# Patient Record
Sex: Female | Born: 1965 | Race: White | Hispanic: No | Marital: Married | Smoking: Never smoker
Health system: Southern US, Community
[De-identification: ages and names within clinical notes are randomized; demographics above are authoritative.]

## PROBLEM LIST (undated history)

## (undated) DIAGNOSIS — L719 Rosacea, unspecified: Secondary | ICD-10-CM

## (undated) DIAGNOSIS — H3552 Pigmentary retinal dystrophy: Secondary | ICD-10-CM

## (undated) HISTORY — DX: Pigmentary retinal dystrophy: H35.52

## (undated) HISTORY — DX: Rosacea, unspecified: L71.9

---
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
## 1999-11-18 ENCOUNTER — Other Ambulatory Visit: Admission: RE | Admit: 1999-11-18 | Discharge: 1999-11-18 | Payer: Self-pay | Admitting: Obstetrics and Gynecology

## 1999-12-23 ENCOUNTER — Encounter (INDEPENDENT_AMBULATORY_CARE_PROVIDER_SITE_OTHER): Payer: Self-pay

## 1999-12-23 ENCOUNTER — Other Ambulatory Visit: Admission: RE | Admit: 1999-12-23 | Discharge: 1999-12-23 | Payer: Self-pay | Admitting: Obstetrics and Gynecology

## 2000-05-24 ENCOUNTER — Other Ambulatory Visit: Admission: RE | Admit: 2000-05-24 | Discharge: 2000-05-24 | Payer: Self-pay | Admitting: Obstetrics and Gynecology

## 2001-07-30 ENCOUNTER — Other Ambulatory Visit: Admission: RE | Admit: 2001-07-30 | Discharge: 2001-07-30 | Payer: Self-pay | Admitting: Obstetrics and Gynecology

## 2002-08-06 ENCOUNTER — Other Ambulatory Visit: Admission: RE | Admit: 2002-08-06 | Discharge: 2002-08-06 | Payer: Self-pay | Admitting: Obstetrics and Gynecology

## 2003-09-10 ENCOUNTER — Other Ambulatory Visit: Admission: RE | Admit: 2003-09-10 | Discharge: 2003-09-10 | Payer: Self-pay | Admitting: Obstetrics and Gynecology

## 2004-10-31 ENCOUNTER — Other Ambulatory Visit: Admission: RE | Admit: 2004-10-31 | Discharge: 2004-10-31 | Payer: Self-pay | Admitting: Obstetrics and Gynecology

## 2005-10-09 ENCOUNTER — Other Ambulatory Visit: Admission: RE | Admit: 2005-10-09 | Discharge: 2005-10-09 | Payer: Self-pay | Admitting: Obstetrics and Gynecology

## 2006-09-05 ENCOUNTER — Encounter: Admission: RE | Admit: 2006-09-05 | Discharge: 2006-09-05 | Payer: Self-pay | Admitting: Obstetrics and Gynecology

## 2006-10-15 ENCOUNTER — Other Ambulatory Visit: Admission: RE | Admit: 2006-10-15 | Discharge: 2006-10-15 | Payer: Self-pay | Admitting: Obstetrics and Gynecology

## 2007-08-29 DIAGNOSIS — L719 Rosacea, unspecified: Secondary | ICD-10-CM

## 2007-08-29 HISTORY — DX: Rosacea, unspecified: L71.9

## 2007-09-19 ENCOUNTER — Encounter: Admission: RE | Admit: 2007-09-19 | Discharge: 2007-09-19 | Payer: Self-pay | Admitting: Obstetrics and Gynecology

## 2007-10-22 ENCOUNTER — Other Ambulatory Visit: Admission: RE | Admit: 2007-10-22 | Discharge: 2007-10-22 | Payer: Self-pay | Admitting: Obstetrics and Gynecology

## 2008-10-05 ENCOUNTER — Encounter: Admission: RE | Admit: 2008-10-05 | Discharge: 2008-10-05 | Payer: Self-pay | Admitting: Obstetrics and Gynecology

## 2008-10-08 ENCOUNTER — Encounter: Admission: RE | Admit: 2008-10-08 | Discharge: 2008-10-08 | Payer: Self-pay | Admitting: Obstetrics and Gynecology

## 2008-11-02 ENCOUNTER — Other Ambulatory Visit: Admission: RE | Admit: 2008-11-02 | Discharge: 2008-11-02 | Payer: Self-pay | Admitting: Obstetrics and Gynecology

## 2009-10-26 ENCOUNTER — Encounter: Admission: RE | Admit: 2009-10-26 | Discharge: 2009-10-26 | Payer: Self-pay | Admitting: Obstetrics and Gynecology

## 2009-11-02 ENCOUNTER — Encounter: Admission: RE | Admit: 2009-11-02 | Discharge: 2009-11-02 | Payer: Self-pay | Admitting: Obstetrics and Gynecology

## 2009-11-10 ENCOUNTER — Other Ambulatory Visit: Admission: RE | Admit: 2009-11-10 | Discharge: 2009-11-10 | Payer: Self-pay | Admitting: Obstetrics and Gynecology

## 2010-05-10 ENCOUNTER — Encounter: Admission: RE | Admit: 2010-05-10 | Discharge: 2010-05-10 | Payer: Self-pay | Admitting: Obstetrics and Gynecology

## 2010-08-28 DIAGNOSIS — H3552 Pigmentary retinal dystrophy: Secondary | ICD-10-CM

## 2010-08-28 HISTORY — DX: Pigmentary retinal dystrophy: H35.52

## 2010-09-18 ENCOUNTER — Encounter: Payer: Self-pay | Admitting: Obstetrics and Gynecology

## 2010-10-06 ENCOUNTER — Other Ambulatory Visit: Payer: Self-pay | Admitting: Obstetrics and Gynecology

## 2010-10-06 DIAGNOSIS — Z1231 Encounter for screening mammogram for malignant neoplasm of breast: Secondary | ICD-10-CM

## 2010-11-02 ENCOUNTER — Ambulatory Visit
Admission: RE | Admit: 2010-11-02 | Discharge: 2010-11-02 | Disposition: A | Discharge status: Home / Self Care | Payer: 59 | Source: Ambulatory Visit | Attending: Obstetrics and Gynecology | Admitting: Obstetrics and Gynecology

## 2010-11-02 DIAGNOSIS — Z1231 Encounter for screening mammogram for malignant neoplasm of breast: Secondary | ICD-10-CM

## 2011-11-07 ENCOUNTER — Other Ambulatory Visit: Payer: Self-pay | Admitting: Obstetrics and Gynecology

## 2011-11-07 DIAGNOSIS — Z1231 Encounter for screening mammogram for malignant neoplasm of breast: Secondary | ICD-10-CM

## 2011-11-17 ENCOUNTER — Ambulatory Visit
Admission: RE | Admit: 2011-11-17 | Discharge: 2011-11-17 | Disposition: A | Discharge status: Home / Self Care | Payer: 59 | Source: Ambulatory Visit | Attending: Obstetrics and Gynecology | Admitting: Obstetrics and Gynecology

## 2011-11-17 DIAGNOSIS — Z1231 Encounter for screening mammogram for malignant neoplasm of breast: Secondary | ICD-10-CM

## 2011-11-17 LAB — HM MAMMOGRAPHY: HM Mammogram: NORMAL

## 2012-11-04 ENCOUNTER — Other Ambulatory Visit: Payer: Self-pay

## 2012-11-04 DIAGNOSIS — Z1231 Encounter for screening mammogram for malignant neoplasm of breast: Secondary | ICD-10-CM

## 2012-11-28 ENCOUNTER — Ambulatory Visit
Admission: RE | Admit: 2012-11-28 | Discharge: 2012-11-28 | Disposition: A | Discharge status: Home / Self Care | Payer: 59 | Source: Ambulatory Visit

## 2012-11-28 DIAGNOSIS — Z1231 Encounter for screening mammogram for malignant neoplasm of breast: Secondary | ICD-10-CM

## 2012-11-29 ENCOUNTER — Encounter: Payer: Self-pay | Admitting: Obstetrics and Gynecology

## 2012-12-04 ENCOUNTER — Encounter: Payer: Self-pay | Admitting: Obstetrics and Gynecology

## 2012-12-04 ENCOUNTER — Ambulatory Visit (INDEPENDENT_AMBULATORY_CARE_PROVIDER_SITE_OTHER): Payer: 59 | Admitting: Obstetrics and Gynecology

## 2012-12-04 VITALS — BP 100/60 | Ht 68.0 in | Wt 153.0 lb

## 2012-12-04 DIAGNOSIS — H3552 Pigmentary retinal dystrophy: Secondary | ICD-10-CM | POA: Insufficient documentation

## 2012-12-04 DIAGNOSIS — Z0000 Encounter for general adult medical examination without abnormal findings: Secondary | ICD-10-CM

## 2012-12-04 DIAGNOSIS — Z01419 Encounter for gynecological examination (general) (routine) without abnormal findings: Secondary | ICD-10-CM

## 2012-12-04 LAB — POCT URINALYSIS DIPSTICK
Nitrite, UA: NEGATIVE
pH, UA: 5

## 2012-12-04 NOTE — Progress Notes (Addendum)
47 y.o.  Married  Caucasian female   [id number] here for annual exam.  Started running - runs 3 miles 3 times a week.  Eyes are about the same.  Hasn't seen a retinal specialist in a while.  Takes 8000 units a day.    Patient's last menstrual period was 11/22/2012.          Sexually active: yes  The current method of family planning is vasectomy.    Exercising: running 3 day a week Last mammogram:  November 04, 2012 Last pap smear:10/2009 normal History of abnormal pap: 2007 Smoking:no Alcohol:7 beers a week Last colonoscopy:none Last Bone Density:  none Last tetanus shot:less than 10 years ago Last cholesterol check: not sure  Hgb:       12.5         Urine:neg    No health maintenance topics applied.  Family History  Problem Relation Age of Onset  . Hypertension Mother   . Cancer Mother     melanoma on face  . Hypertension Father    Mother just finished chemo and radiation for metastatic melanoma There is no problem list on file for this patient.   Past Medical History  Diagnosis Date  . Retinitis pigmentosa 2012  . Rosacea 2009    Past Surgical History  Procedure Laterality Date  . Tonsillectomy  1978    Allergies: Review of patient's allergies indicates no known allergies.  Current Outpatient Prescriptions  Medication Sig Dispense Refill  . Calcium Carbonate-Vitamin D (CALTRATE 600+D PO) Take by mouth.      . Cholecalciferol (VITAMIN D) 400 UNITS capsule Take 400 Units by mouth daily.      Marland Kitchen ibuprofen (ADVIL,MOTRIN) 100 MG tablet Take 100 mg by mouth every 6 (six) hours as needed for fever.      . vitamin A 8000 UNIT capsule Take 8,000 Units by mouth daily.       No current facility-administered medications for this visit.    ROS: Pertinent items are noted in HPI.  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)  Exam:    BP 100/60  Ht 5\' 8"  (1.727 m)  Wt 153 lb (69.4 kg)  BMI 23.27 kg/m2  LMP 11/22/2012  Up 5 pounds from last year Wt  Readings from Last 3 Encounters:  12/04/12 153 lb (69.4 kg)     Ht Readings from Last 3 Encounters:  12/04/12 5\' 8"  (1.727 m)    General appearance: alert, cooperative and appears stated age Head: Normocephalic, without obvious abnormality, atraumatic Neck: no adenopathy, supple, symmetrical, trachea midline and thyroid not enlarged, symmetric, no tenderness/mass/nodules Lungs: clear to auscultation bilaterally Breasts: Inspection negative, No nipple retraction or dimpling, No nipple discharge or bleeding, No axillary or supraclavicular adenopathy, Normal to palpation without dominant masses Heart: regular rate and rhythm Abdomen: soft, non-tender; bowel sounds normal; no masses,  no organomegaly Extremities: extremities normal, atraumatic, no cyanosis or edema Skin: Skin color, texture, turgor normal. No rashes or lesions Lymph nodes: Cervical, supraclavicular, and axillary nodes normal. No abnormal inguinal nodes palpated Neurologic: Grossly normal   Pelvic: External genitalia:  no lesions              Urethra:  normal appearing urethra with no masses, tenderness or lesions              Bartholins and Skenes: normal                 Vagina: normal appearing vagina with normal  color and discharge, no lesions              Cervix: normal appearance              Pap taken: yes        Bimanual Exam:  Uterus:  uterus is normal size, shape, consistency and nontender, RV and cannot feel known fibroid                                       Adnexa: normal adnexa in size, nontender and no masses                                      Rectovaginal: Confirms                                      Anus:  normal sphincter tone, no lesions  A: normal gyn exam, vasectomy     Retinitis pigmentosa, treated with Vit A.  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  Pelvis otherwise nl.     P:  mammogram counseled on breast self exam, mammography screening, adequate intake of calcium and vitamin D, diet and exercise return annually or prn   Check CMP and FLP.  CMP d/t Vit A tx.     An After Visit Summary was printed and given to the patient.

## 2012-12-04 NOTE — Patient Instructions (Signed)

## 2012-12-05 ENCOUNTER — Telehealth: Payer: Self-pay | Admitting: Obstetrics and Gynecology

## 2012-12-05 LAB — COMPREHENSIVE METABOLIC PANEL
CO2: 31 mEq/L (ref 19–32)
Chloride: 105 mEq/L (ref 96–112)
Creat: 0.86 mg/dL (ref 0.50–1.10)
Total Bilirubin: 0.4 mg/dL (ref 0.3–1.2)
Total Protein: 6.4 g/dL (ref 6.0–8.3)

## 2012-12-05 LAB — LIPID PANEL
Cholesterol: 192 mg/dL (ref 0–200)
HDL: 68 mg/dL (ref >39)
LDL Cholesterol: 101 mg/dL — ABNORMAL HIGH (ref 0–99)
Total CHOL/HDL Ratio: 2.8 Ratio
VLDL: 23 mg/dL (ref 0–40)

## 2012-12-06 LAB — HEMOGLOBIN, FINGERSTICK: Hemoglobin, fingerstick: 12.5 g/dL (ref 12.0–16.0)

## 2012-12-06 NOTE — Telephone Encounter (Signed)
Pt already notified of lab results today cm

## 2013-11-27 ENCOUNTER — Other Ambulatory Visit: Payer: Self-pay

## 2013-11-27 DIAGNOSIS — Z1231 Encounter for screening mammogram for malignant neoplasm of breast: Secondary | ICD-10-CM

## 2013-12-17 ENCOUNTER — Ambulatory Visit: Payer: 59 | Admitting: Gynecology

## 2013-12-17 ENCOUNTER — Ambulatory Visit
Admission: RE | Admit: 2013-12-17 | Discharge: 2013-12-17 | Disposition: A | Discharge status: Home / Self Care | Payer: Self-pay | Source: Ambulatory Visit

## 2013-12-17 ENCOUNTER — Ambulatory Visit: Payer: 59 | Admitting: Obstetrics and Gynecology

## 2013-12-17 ENCOUNTER — Encounter (INDEPENDENT_AMBULATORY_CARE_PROVIDER_SITE_OTHER): Payer: Self-pay

## 2013-12-17 DIAGNOSIS — Z1231 Encounter for screening mammogram for malignant neoplasm of breast: Secondary | ICD-10-CM

## 2013-12-23 ENCOUNTER — Encounter: Payer: Self-pay | Admitting: Gynecology

## 2013-12-23 ENCOUNTER — Ambulatory Visit (INDEPENDENT_AMBULATORY_CARE_PROVIDER_SITE_OTHER): Payer: 59 | Admitting: Gynecology

## 2013-12-23 VITALS — BP 90/60 | HR 64 | Temp 98.2°F | Ht 67.75 in | Wt 150.4 lb

## 2013-12-23 DIAGNOSIS — Z01419 Encounter for gynecological examination (general) (routine) without abnormal findings: Secondary | ICD-10-CM

## 2013-12-23 DIAGNOSIS — Z0000 Encounter for general adult medical examination without abnormal findings: Secondary | ICD-10-CM

## 2013-12-23 LAB — POCT URINALYSIS DIPSTICK
BILIRUBIN UA: NEGATIVE
Blood, UA: NEGATIVE
Glucose, UA: NEGATIVE
Ketones, UA: NEGATIVE
Leukocytes, UA: NEGATIVE
NITRITE UA: NEGATIVE
PH UA: 5
PROTEIN UA: NEGATIVE
Urobilinogen, UA: NEGATIVE

## 2013-12-23 LAB — HEMOGLOBIN, FINGERSTICK: Hemoglobin, fingerstick: 14.2 g/dL (ref 12.0–16.0)

## 2013-12-23 NOTE — Progress Notes (Signed)
48 y.o. marriedCaucasian female   [phone] here for annual exam. Pt is currently sexually active.  Saw retinal specialist at [facility], noncompliant with vit a.  Cycles are regular and no dyspareunia  Pt has not been able to exercise due to other obligations with sons.  Patient's last menstrual period was 12/10/2013.          Sexually active: yes  The current method of family planning is vasectomy.    Exercising: yes  some exercise Last pap: 12-04-12 neg HPV HR neg Alcohol: occ beer Tobacco: none BSE: done occ Mammo: 12-17-13 neg Hgb-14.2, Poct urine-neg    Health Maintenance  Topic Date Due  . Tetanus/tdap  08/30/1985  . Influenza Vaccine  03/28/2014  . Pap Smear  12/05/2015    Family History  Problem Relation Age of Onset  . Hypertension Mother   . Cancer Mother     melanoma on face  . Hypertension Father   . Breast cancer Maternal Aunt   . Heart attack Paternal Grandfather     Patient Active Problem List   Diagnosis Date Noted  . Retinitis pigmentosa both eyes 12/04/2012    Past Medical History  Diagnosis Date  . Retinitis pigmentosa 2012  . Rosacea 2009    Past Surgical History  Procedure Laterality Date  . Tonsillectomy  1978    Allergies: Review of patient's allergies indicates no known allergies.  Current Outpatient Prescriptions  Medication Sig Dispense Refill  . ibuprofen (ADVIL,MOTRIN) 100 MG tablet Take 100 mg by mouth every 6 (six) hours as needed for fever.       No current facility-administered medications for this visit.    ROS: Pertinent items are noted in HPI.  Exam:    BP 90/60  Pulse 64  Temp(Src) 98.2 F (36.8 C)  Ht 5' 7.75" (1.721 m)  Wt 150 lb 6.4 oz (68.221 kg)  BMI 23.03 kg/m2  LMP 12/10/2013 Weight change: @WEIGHTCHANGE @ Last 3 height recordings:  Ht Readings from Last 3 Encounters:  12/23/13 5' 7.75" (1.721 m)  12/04/12 5\' 8"  (1.727 m)   General appearance: alert, cooperative and appears stated age Head: Normocephalic,  without obvious abnormality, atraumatic Neck: no adenopathy, no carotid bruit, no JVD, supple, symmetrical, trachea midline and thyroid not enlarged, symmetric, no tenderness/mass/nodules Lungs: clear to auscultation bilaterally Breasts: normal appearance, no masses or tenderness Heart: regular rate and rhythm, S1, S2 normal, no murmur, click, rub or gallop Abdomen: soft, non-tender; bowel sounds normal; no masses,  no organomegaly Extremities: extremities normal, atraumatic, no cyanosis or edema Skin: Skin color, texture, turgor normal. No rashes or lesions Lymph nodes: Cervical, supraclavicular, and axillary nodes normal. no inguinal nodes palpated Neurologic: Grossly normal   Pelvic: External genitalia:  no lesions              Urethra: normal appearing urethra with no masses, tenderness or lesions              Bartholins and Skenes: normal                 Vagina: normal appearing vagina with normal color and discharge, no lesions              Cervix: normal appearance              Pap taken: no        Bimanual Exam:  Uterus:  uterus is normal size, shape, consistency and nontender  Adnexa:    normal adnexa in size, nontender and no masses                                      Rectovaginal: Confirms                                      Anus:  normal sphincter tone, no lesions  A: well woman      P: mammogram pap smear guidelines reviewed counseled on breast self exam, mammography screening, adequate intake of calcium and vitamin D, diet and exercise return annually or prn   An After Visit Summary was printed and given to the patient.

## 2013-12-23 NOTE — Patient Instructions (Signed)

## 2014-05-21 ENCOUNTER — Telehealth: Payer: Self-pay | Admitting: Gynecology

## 2014-05-21 NOTE — Telephone Encounter (Signed)
Patient dropped off a "proof of  physical exam letter", sent to dr.Lathrop for signature. Will mail to patient in self addressed envelope.

## 2014-05-26 ENCOUNTER — Encounter: Payer: Self-pay | Admitting: Gynecology

## 2014-06-29 ENCOUNTER — Encounter: Payer: Self-pay | Admitting: Gynecology

## 2014-07-28 ENCOUNTER — Telehealth: Payer: Self-pay

## 2014-07-28 NOTE — Telephone Encounter (Signed)
AEX has been rescheduled with Kem Boroughs on 12/28/14

## 2014-12-25 ENCOUNTER — Ambulatory Visit: Payer: 59 | Admitting: Gynecology

## 2014-12-28 ENCOUNTER — Ambulatory Visit: Payer: Self-pay | Admitting: Nurse Practitioner

## 2015-01-20 ENCOUNTER — Other Ambulatory Visit: Payer: Self-pay

## 2015-02-04 ENCOUNTER — Other Ambulatory Visit: Payer: Self-pay

## 2015-02-04 DIAGNOSIS — Z1231 Encounter for screening mammogram for malignant neoplasm of breast: Secondary | ICD-10-CM

## 2015-03-09 ENCOUNTER — Ambulatory Visit
Admission: RE | Admit: 2015-03-09 | Discharge: 2015-03-09 | Disposition: A | Discharge status: Home / Self Care | Payer: 59 | Source: Ambulatory Visit

## 2015-03-09 DIAGNOSIS — Z1231 Encounter for screening mammogram for malignant neoplasm of breast: Secondary | ICD-10-CM

## 2015-03-31 ENCOUNTER — Ambulatory Visit (INDEPENDENT_AMBULATORY_CARE_PROVIDER_SITE_OTHER): Payer: 59 | Admitting: Obstetrics and Gynecology

## 2015-03-31 ENCOUNTER — Encounter: Payer: Self-pay | Admitting: Obstetrics and Gynecology

## 2015-03-31 VITALS — BP 92/60 | HR 84 | Resp 16 | Ht 67.75 in | Wt 149.0 lb

## 2015-03-31 DIAGNOSIS — Z0000 Encounter for general adult medical examination without abnormal findings: Secondary | ICD-10-CM

## 2015-03-31 DIAGNOSIS — D252 Subserosal leiomyoma of uterus: Secondary | ICD-10-CM | POA: Diagnosis not present

## 2015-03-31 DIAGNOSIS — Z01419 Encounter for gynecological examination (general) (routine) without abnormal findings: Secondary | ICD-10-CM | POA: Diagnosis not present

## 2015-03-31 LAB — POCT URINALYSIS DIPSTICK
Bilirubin, UA: NEGATIVE
GLUCOSE UA: NEGATIVE
KETONES UA: NEGATIVE
LEUKOCYTES UA: NEGATIVE
NITRITE UA: NEGATIVE
Protein, UA: NEGATIVE
RBC UA: NEGATIVE
Spec Grav, UA: 1.015
UROBILINOGEN UA: NEGATIVE
pH, UA: 7

## 2015-03-31 NOTE — Patient Instructions (Signed)

## 2015-03-31 NOTE — Progress Notes (Signed)
Patient ID: Wendy Small, female   DOB: [birth year], 49 y.o.   MRN: [id number] 49 y.o. [id number] MarriedCaucasianF here for annual exam.   Menses q month x 3-4 days. Saturates a pad in 2-4 hours. No BTB, no cramps. Sexually active, no dyspareunia. In 2013 the patient had an ultrasound for pain and was noted to have a fibroid uterus, largest fibroid was 3.8 cm and subserosal.   Patient's last menstrual period was 03/17/2015.          Sexually active: Yes.    The current method of family planning is vasectomy.    Exercising: Yes.    walking Smoker:  no  Health Maintenance: Pap:  12-04-12 WNL History of abnormal Pap:  no MMG:  03-09-15 WNL Colonoscopy:  N/A BMD:   N/A TDaP:  Up to date per patient  Screening Labs: labs drawn today , Hb today: 13.3, Urine today: WNL    reports that she has never smoked. She has never used smokeless tobacco. She reports that she drinks about 3.0 oz of alcohol per week. She reports that she does not use illicit drugs.  Past Medical History  Diagnosis Date  . Retinitis pigmentosa 2012  . Rosacea 2009    Past Surgical History  Procedure Laterality Date  . Tonsillectomy  1978    Current Outpatient Prescriptions  Medication Sig Dispense Refill  . ibuprofen (ADVIL,MOTRIN) 100 MG tablet Take 100 mg by mouth every 6 (six) hours as needed for fever.     No current facility-administered medications for this visit.    Family History  Problem Relation Age of Onset  . Hypertension Mother   . Cancer Mother     melanoma on face  . Hypertension Father   . Breast cancer Maternal Aunt   . Heart attack Paternal Grandfather     ROS:  Pertinent items are noted in HPI.  Otherwise, a comprehensive ROS was negative.  Exam:   BP 92/60 mmHg  Pulse 84  Resp 16  Ht 5' 7.75" (1.721 m)  Wt 149 lb (67.586 kg)  BMI 22.82 kg/m2  LMP 03/17/2015  Weight change: @WEIGHTCHANGE @ Height:   Height: 5' 7.75" (172.1 cm)  Ht Readings from Last 3 Encounters:  03/31/15 5'  7.75" (1.721 m)  12/23/13 5' 7.75" (1.721 m)  12/04/12 5\' 8"  (1.727 m)    General appearance: alert, cooperative and appears stated age Head: Normocephalic, without obvious abnormality, atraumatic Neck: no adenopathy, supple, symmetrical, trachea midline and thyroid normal to inspection and palpation Lungs: clear to auscultation bilaterally Breasts: normal appearance, no masses or tenderness Heart: regular rate and rhythm Abdomen: soft, non-tender; bowel sounds normal; no masses,  no organomegaly Extremities: extremities normal, atraumatic, no cyanosis or edema Skin: Skin color, texture, turgor normal. No rashes or lesions Lymph nodes: Cervical, supraclavicular, and axillary nodes normal. No abnormal inguinal nodes palpated Neurologic: Grossly normal   Pelvic: External genitalia:  no lesions              Urethra:  normal appearing urethra with no masses, tenderness or lesions              Bartholins and Skenes: normal                 Vagina: normal appearing vagina with normal color and discharge, no lesions              Cervix: no lesions              Pap taken:  No. Bimanual Exam:  Uterus:  The uterus is anteverted, normal sized, mobile, there is a 4 cm projection off of the right side of the uterus, cw her prior diagnosis of a suberosal myoma.               Adnexa: normal adnexa and no mass, fullness, tenderness               Rectovaginal: Confirms               Anus:  normal sphincter tone, no lesions  Chaperone was present for exam.  A:  Well Woman with normal exam  Fibroid uterus, stable  Screening labs  P:   Lipid panel, vit D, CMP  No pap this year  Mammogram UTD

## 2015-04-01 ENCOUNTER — Other Ambulatory Visit: Payer: Self-pay | Admitting: Obstetrics and Gynecology

## 2015-04-01 DIAGNOSIS — Z13220 Encounter for screening for lipoid disorders: Secondary | ICD-10-CM

## 2015-04-01 LAB — LIPID PANEL
CHOL/HDL RATIO: 3.1 ratio (ref <=5.0)
CHOLESTEROL: 198 mg/dL (ref 125–200)
HDL: 64 mg/dL (ref >=46)
LDL Cholesterol: 97 mg/dL (ref <130)
Triglycerides: 186 mg/dL — ABNORMAL HIGH (ref <150)
VLDL: 37 mg/dL — ABNORMAL HIGH (ref <30)

## 2015-04-01 LAB — COMPREHENSIVE METABOLIC PANEL
ALT: 8 U/L (ref 6–29)
AST: 17 U/L (ref 10–35)
Albumin: 4.1 g/dL (ref 3.6–5.1)
Alkaline Phosphatase: 66 U/L (ref 33–115)
BILIRUBIN TOTAL: 0.4 mg/dL (ref 0.2–1.2)
BUN: 12 mg/dL (ref 7–25)
CALCIUM: 9.6 mg/dL (ref 8.6–10.2)
CO2: 25 mmol/L (ref 20–31)
CREATININE: 0.92 mg/dL (ref 0.50–1.10)
Chloride: 102 mmol/L (ref 98–110)
Glucose, Bld: 85 mg/dL (ref 65–99)
Potassium: 4.1 mmol/L (ref 3.5–5.3)
SODIUM: 137 mmol/L (ref 135–146)
TOTAL PROTEIN: 7.2 g/dL (ref 6.1–8.1)

## 2015-04-01 LAB — VITAMIN D 25 HYDROXY (VIT D DEFICIENCY, FRACTURES): VIT D 25 HYDROXY: 30 ng/mL (ref 30–100)

## 2015-04-01 LAB — HEMOGLOBIN, FINGERSTICK: HEMOGLOBIN, FINGERSTICK: 13.3 g/dL (ref 12.0–16.0)

## 2015-04-26 ENCOUNTER — Ambulatory Visit: Payer: Self-pay | Admitting: Obstetrics and Gynecology

## 2015-05-18 ENCOUNTER — Other Ambulatory Visit (INDEPENDENT_AMBULATORY_CARE_PROVIDER_SITE_OTHER): Payer: 59

## 2015-05-18 DIAGNOSIS — Z13220 Encounter for screening for lipoid disorders: Secondary | ICD-10-CM

## 2015-05-18 LAB — LIPID PANEL
CHOL/HDL RATIO: 3.1 ratio (ref <=5.0)
Cholesterol: 191 mg/dL (ref 125–200)
HDL: 62 mg/dL (ref >=46)
LDL CALC: 114 mg/dL (ref <130)
Triglycerides: 73 mg/dL (ref <150)
VLDL: 15 mg/dL (ref <30)

## 2016-04-03 ENCOUNTER — Encounter: Payer: Self-pay | Admitting: Obstetrics and Gynecology

## 2016-04-03 ENCOUNTER — Ambulatory Visit (INDEPENDENT_AMBULATORY_CARE_PROVIDER_SITE_OTHER): Payer: BLUE CROSS/BLUE SHIELD | Admitting: Obstetrics and Gynecology

## 2016-04-03 VITALS — BP 120/80 | HR 68 | Resp 12 | Ht 67.75 in | Wt 151.4 lb

## 2016-04-03 DIAGNOSIS — Z01419 Encounter for gynecological examination (general) (routine) without abnormal findings: Secondary | ICD-10-CM

## 2016-04-03 DIAGNOSIS — Z124 Encounter for screening for malignant neoplasm of cervix: Secondary | ICD-10-CM | POA: Diagnosis not present

## 2016-04-03 DIAGNOSIS — Z0000 Encounter for general adult medical examination without abnormal findings: Secondary | ICD-10-CM | POA: Diagnosis not present

## 2016-04-03 NOTE — Patient Instructions (Signed)

## 2016-04-03 NOTE — Progress Notes (Signed)
50 y.o. [id number] MarriedCaucasianF here for annual exam.  Sexually active, no pain. No menopausal c/o.  Period Cycle (Days): 28 Period Duration (Days): 4-5 days Period Pattern: Regular Menstrual Flow: Moderate, Light Menstrual Control: Maxi pad Menstrual Control Change Freq (Hours): 3-4 times  aday Dysmenorrhea: None  Patient's last menstrual period was 03/10/2016.          Sexually active: Yes.    The current method of family planning is vasectomy.    Exercising: Yes.    Walking Smoker:  no  Health Maintenance: Pap:  12/04/12 WNL neg HR HPV History of abnormal Pap:  no MMG:  03/11/15 BIRADS1 Colonoscopy:  None  BMD:   None TDaP:  UTD   reports that she has never smoked. She has never used smokeless tobacco. She reports that she drinks about 3.0 oz of alcohol per week . She reports that she does not use drugs.2 kids going to college next week. 2 boys, [place] and [place]. 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   Past Medical History:  Diagnosis Date  . Retinitis pigmentosa 2012  . Rosacea 2009    Past Surgical History:  Procedure Laterality Date  . TONSILLECTOMY  1978    Current Outpatient Prescriptions  Medication Sig Dispense Refill  . ibuprofen (ADVIL,MOTRIN) 100 MG tablet Take 100 mg by mouth every 6 (six) hours as needed for fever.     No current facility-administered medications for this visit.     Family History  Problem Relation Age of Onset  . Hypertension Mother   . Cancer Mother     melanoma on face  . Hypertension Father   . Breast cancer Maternal Aunt   . Heart attack Paternal Grandfather     Review of Systems  Constitutional: Negative.   HENT: Negative.   Eyes: Negative.   Respiratory: Negative.   Cardiovascular: Negative.   Gastrointestinal: Negative.   Endocrine: Negative.   Genitourinary: Negative.   Musculoskeletal: Negative.   Skin: Negative.   Allergic/Immunologic: Negative.   Neurological: Negative.   Hematological: Negative.    Psychiatric/Behavioral: Negative.     Exam:   BP 120/80 (BP Location: Right Arm, Patient Position: Sitting, Cuff Size: Normal)   Pulse 68   Resp 12   Ht 5' 7.75" (1.721 m)   Wt 151 lb 6.4 oz (68.7 kg)   LMP 03/10/2016   BMI 23.19 kg/m   Weight change: @WEIGHTCHANGE @ Height:   Height: 5' 7.75" (172.1 cm)  Ht Readings from Last 3 Encounters:  04/03/16 5' 7.75" (1.721 m)  03/31/15 5' 7.75" (1.721 m)  12/23/13 5' 7.75" (1.721 m)    General appearance: alert, cooperative and appears stated age Head: Normocephalic, without obvious abnormality, atraumatic Neck: no adenopathy, supple, symmetrical, trachea midline and thyroid normal to inspection and palpation Lungs: clear to auscultation bilaterally Breasts: normal appearance, no masses or tenderness Heart: regular rate and rhythm Abdomen: soft, non-tender; bowel sounds normal; no masses,  no organomegaly Extremities: extremities normal, atraumatic, no cyanosis or edema Skin: Skin color, texture, turgor normal. No rashes or lesions Lymph nodes: Cervical, supraclavicular, and axillary nodes normal. No abnormal inguinal nodes palpated Neurologic: Grossly normal   Pelvic: External genitalia:  no lesions              Urethra:  normal appearing urethra with no masses, tenderness or lesions              Bartholins and Skenes: normal  Vagina: normal appearing vagina with normal color and discharge, no lesions              Cervix: no lesions and friable with pap               Bimanual Exam:  Uterus:  normal size, contour, position, consistency, mobility, non-tender and anteverted              Adnexa: no mass, fullness, tenderness               Rectovaginal: Confirms               Anus:  normal sphincter tone, no lesions  Chaperone was present for exam.  A:  Well Woman with normal exam  P:   Mammogram  Pap with hpv  Colonoscopy after she turns 51  She will return for a CBC, CMP, vit D  Normal lipids last year

## 2016-04-05 LAB — IPS PAP TEST WITH HPV

## 2016-04-13 ENCOUNTER — Telehealth: Payer: Self-pay | Admitting: *Deleted

## 2016-04-13 ENCOUNTER — Encounter: Payer: Self-pay | Admitting: Obstetrics and Gynecology

## 2016-04-13 NOTE — Telephone Encounter (Signed)
Patient scheduled for labs on 04-18-16. Ok to add TDAP?

## 2016-04-13 NOTE — Telephone Encounter (Signed)
Yes she can have a TDAP at her next appointment. Please set her up to see Dr Collene Mares after she turns 50 for a colonosocpy

## 2016-04-13 NOTE — Telephone Encounter (Signed)
My Chart message from patient:  I just saw Dr Talbert Nan 2 weeks ago and have thought up 2 questions -  -I followed up on my last tdap and it was 10-01-2006. Im coming in next week for lab work (on 22nd)- can i get tetnas shot then when im in the office (and paperwork to take back to my employer)?  -We talked about me getting coloscopy in Jan when i turn 51 but didnt talk about details. Do i need referral? Who do you recommend, etc. Just wanting more info as ive never done this before.    Thanks -    Wendy Small DOB 09/20/1965

## 2016-04-13 NOTE — Telephone Encounter (Signed)
Telephone note created for this encounter.

## 2016-04-14 NOTE — Telephone Encounter (Signed)
Called patient at [phone] and per DPR, left detailed message stating she can have Tdap when she comes next week for labs and added to lab schedule. Also advised she could call Dr. Lorie Apley office to schedule screening colonoscopy when she turns 54 and left Dr. Lorie Apley phone number for her. Advised she may want to call her office 1-2 months in advance to make appointment.

## 2016-04-18 ENCOUNTER — Other Ambulatory Visit (INDEPENDENT_AMBULATORY_CARE_PROVIDER_SITE_OTHER): Payer: BLUE CROSS/BLUE SHIELD

## 2016-04-18 ENCOUNTER — Ambulatory Visit (INDEPENDENT_AMBULATORY_CARE_PROVIDER_SITE_OTHER): Payer: BLUE CROSS/BLUE SHIELD | Admitting: *Deleted

## 2016-04-18 VITALS — BP 102/60 | HR 84 | Resp 14 | Wt 150.0 lb

## 2016-04-18 DIAGNOSIS — Z0000 Encounter for general adult medical examination without abnormal findings: Secondary | ICD-10-CM

## 2016-04-18 DIAGNOSIS — Z23 Encounter for immunization: Secondary | ICD-10-CM

## 2016-04-18 LAB — CBC
HEMATOCRIT: 43.4 % (ref 35.0–45.0)
Hemoglobin: 14.3 g/dL (ref 11.7–15.5)
MCH: 28.7 pg (ref 27.0–33.0)
MCHC: 32.9 g/dL (ref 32.0–36.0)
MCV: 87.1 fL (ref 80.0–100.0)
MPV: 9.9 fL (ref 7.5–12.5)
Platelets: 160 10*3/uL (ref 140–400)
RBC: 4.98 MIL/uL (ref 3.80–5.10)
RDW: 14.5 % (ref 11.0–15.0)
WBC: 6.8 10*3/uL (ref 3.8–10.8)

## 2016-04-18 LAB — COMPREHENSIVE METABOLIC PANEL
ALK PHOS: 58 U/L (ref 33–115)
ALT: 9 U/L (ref 6–29)
AST: 17 U/L (ref 10–35)
Albumin: 3.8 g/dL (ref 3.6–5.1)
BILIRUBIN TOTAL: 0.6 mg/dL (ref 0.2–1.2)
BUN: 11 mg/dL (ref 7–25)
CO2: 25 mmol/L (ref 20–31)
Calcium: 8.7 mg/dL (ref 8.6–10.2)
Chloride: 104 mmol/L (ref 98–110)
Creat: 0.84 mg/dL (ref 0.50–1.10)
Glucose, Bld: 92 mg/dL (ref 65–99)
POTASSIUM: 4.5 mmol/L (ref 3.5–5.3)
Sodium: 138 mmol/L (ref 135–146)
TOTAL PROTEIN: 6.7 g/dL (ref 6.1–8.1)

## 2016-04-18 NOTE — Progress Notes (Signed)
Patient here for a Tdap injection.  Tdap injection in left deltiod. Patient tolerated well.

## 2016-04-19 LAB — VITAMIN D 25 HYDROXY (VIT D DEFICIENCY, FRACTURES): Vit D, 25-Hydroxy: 34 ng/mL (ref 30–100)

## 2016-05-10 ENCOUNTER — Other Ambulatory Visit: Payer: Self-pay | Admitting: Obstetrics and Gynecology

## 2016-05-10 DIAGNOSIS — Z1231 Encounter for screening mammogram for malignant neoplasm of breast: Secondary | ICD-10-CM

## 2016-05-18 ENCOUNTER — Ambulatory Visit: Payer: 59

## 2016-05-24 ENCOUNTER — Ambulatory Visit
Admission: RE | Admit: 2016-05-24 | Discharge: 2016-05-24 | Disposition: A | Discharge status: Home / Self Care | Payer: BLUE CROSS/BLUE SHIELD | Source: Ambulatory Visit | Attending: Obstetrics and Gynecology | Admitting: Obstetrics and Gynecology

## 2016-05-24 DIAGNOSIS — Z1231 Encounter for screening mammogram for malignant neoplasm of breast: Secondary | ICD-10-CM

## 2016-11-08 ENCOUNTER — Encounter: Payer: Self-pay | Admitting: Gastroenterology

## 2016-12-19 ENCOUNTER — Ambulatory Visit (AMBULATORY_SURGERY_CENTER): Payer: Self-pay

## 2016-12-19 ENCOUNTER — Encounter: Payer: Self-pay | Admitting: Gastroenterology

## 2016-12-19 VITALS — Ht 68.0 in | Wt 150.6 lb

## 2016-12-19 DIAGNOSIS — Z1211 Encounter for screening for malignant neoplasm of colon: Secondary | ICD-10-CM

## 2016-12-19 MED ORDER — SUPREP BOWEL PREP KIT 17.5-3.13-1.6 GM/177ML PO SOLN: 1.0000 | Freq: Once | ORAL | 0 refills | Status: AC

## 2016-12-19 NOTE — Progress Notes (Signed)
No diet meds No home oxygen No past problems with anesthesia No allergies to eggs or soy  Registered for emmi

## 2016-12-27 ENCOUNTER — Encounter: Payer: BLUE CROSS/BLUE SHIELD | Admitting: Gastroenterology

## 2016-12-27 ENCOUNTER — Encounter: Payer: Self-pay | Admitting: Gastroenterology

## 2016-12-27 ENCOUNTER — Ambulatory Visit (AMBULATORY_SURGERY_CENTER): Payer: BLUE CROSS/BLUE SHIELD | Admitting: Gastroenterology

## 2016-12-27 VITALS — BP 110/64 | HR 62 | Temp 98.9°F | Resp 11 | Ht 68.0 in | Wt 150.0 lb

## 2016-12-27 DIAGNOSIS — Z1212 Encounter for screening for malignant neoplasm of rectum: Secondary | ICD-10-CM

## 2016-12-27 DIAGNOSIS — Z1211 Encounter for screening for malignant neoplasm of colon: Secondary | ICD-10-CM

## 2016-12-27 MED ORDER — SODIUM CHLORIDE 0.9 % IV SOLN: 500.0000 mL | INTRAVENOUS | Status: DC

## 2016-12-27 NOTE — Progress Notes (Signed)
To recovery, report to Monday, nRN, VSS

## 2016-12-27 NOTE — Patient Instructions (Signed)
YOU HAD AN ENDOSCOPIC PROCEDURE TODAY AT THE [facility]:   Refer to the procedure report that was given to you for any specific questions about what was found during the examination.  If the procedure report does not answer your questions, please call your gastroenterologist to clarify.  If you requested that your care partner not be given the details of your procedure findings, then the procedure report has been included in a sealed envelope for you to review at your convenience later.  YOU SHOULD EXPECT: Some feelings of bloating in the abdomen. Passage of more gas than usual.  Walking can help get rid of the air that was put into your GI tract during the procedure and reduce the bloating. If you had a lower endoscopy (such as a colonoscopy or flexible sigmoidoscopy) you may notice spotting of blood in your stool or on the toilet paper. If you underwent a bowel prep for your procedure, you may not have a normal bowel movement for a few days.  Please Note:  You might notice some irritation and congestion in your nose or some drainage.  This is from the oxygen used during your procedure.  There is no need for concern and it should clear up in a day or so.  SYMPTOMS TO REPORT IMMEDIATELY:   Following lower endoscopy (colonoscopy or flexible sigmoidoscopy):  Excessive amounts of blood in the stool  Significant tenderness or worsening of abdominal pains  Swelling of the abdomen that is new, acute  Fever of 100F or higher   Please read all handouts given to you by your recovery nurse.  For urgent or emergent issues, a gastroenterologist can be reached at any hour by calling [phone].   DIET:  We do recommend a small meal at first, but then you may proceed to your regular diet.  Drink plenty of fluids but you should avoid alcoholic beverages for 24 hours.  ACTIVITY:  You should plan to take it easy for the rest of today and you should NOT DRIVE or use heavy machinery until  tomorrow (because of the sedation medicines used during the test).    FOLLOW UP: Our staff will call the number listed on your records the next business day following your procedure to check on you and address any questions or concerns that you may have regarding the information given to you following your procedure. If we do not reach you, we will leave a message.  However, if you are feeling well and you are not experiencing any problems, there is no need to return our call.  We will assume that you have returned to your regular daily activities without incident.  If any biopsies were taken you will be contacted by phone or by letter within the next 1-3 weeks.  Please call us at [phone] if you have not heard about the biopsies in 3 weeks.    SIGNATURES/CONFIDENTIALITY: You and/or your care partner have signed paperwork which will be entered into your electronic medical record.  These signatures attest to the fact that that the information above on your After Visit Summary has been reviewed and is understood.  Full responsibility of the confidentiality of this discharge information lies with you and/or your care-partner.  Thank you for letting us take care of your healthcare needs today. 

## 2016-12-27 NOTE — Op Note (Signed)
[facility] Patient Name: Wendy Small Procedure Date: 12/27/2016 10:48 AM MRN: [id number] Endoscopist: Milus Banister , MD Age: 51 Referring MD:  Date of Birth: 10/01/1965 Gender: Female Account #: [id number] Procedure:                Colonoscopy Indications:              Screening for colorectal malignant neoplasm Medicines:                Monitored Anesthesia Care Procedure:                Pre-Anesthesia Assessment:                           - Prior to the procedure, a History and Physical                            was performed, and patient medications and                            allergies were reviewed. The patient's tolerance of                            previous anesthesia was also reviewed. The risks                            and benefits of the procedure and the sedation                            options and risks were discussed with the patient.                            All questions were answered, and informed consent                            was obtained. Prior Anticoagulants: The patient has                            taken no previous anticoagulant or antiplatelet                            agents. ASA Grade Assessment: II - A patient with                            mild systemic disease. After reviewing the risks                            and benefits, the patient was deemed in                            satisfactory condition to undergo the procedure.                           After obtaining informed consent, the colonoscope  was passed under direct vision. Throughout the                            procedure, the patient's blood pressure, pulse, and                            oxygen saturations were monitored continuously. The                            Colonoscope was introduced through the anus and                            advanced to the the cecum, identified by                            appendiceal orifice  and ileocecal valve. The                            colonoscopy was performed without difficulty. The                            patient tolerated the procedure well. The quality                            of the bowel preparation was excellent. The                            ileocecal valve, appendiceal orifice, and rectum                            were photographed. Scope In: 10:50:47 AM Scope Out: 11:04:40 AM Scope Withdrawal Time: 0 hours 7 minutes 33 seconds  Total Procedure Duration: 0 hours 13 minutes 53 seconds  Findings:                 The entire examined colon appeared normal on direct                            and retroflexion views.                           Mild left sided diverticulosis. Complications:            No immediate complications. Estimated blood loss:                            None. Estimated Blood Loss:     Estimated blood loss: none. Impression:               - The entire examined colon is normal on direct and                            retroflexion views.                           - Mild left sided diverticulosis. Recommendation:           -  Patient has a contact number available for                            emergencies. The signs and symptoms of potential                            delayed complications were discussed with the                            patient. Return to normal activities tomorrow.                            Written discharge instructions were provided to the                            patient.                           - Resume previous diet.                           - Continue present medications.                           - Repeat colonoscopy in 10 years for screening                            purposes. Milus Banister, MD 12/27/2016 11:07:52 AM This report has been signed electronically.

## 2016-12-27 NOTE — Progress Notes (Signed)
Pt's states no medical or surgical changes since previsit or office visit. 

## 2016-12-28 ENCOUNTER — Telehealth: Payer: Self-pay

## 2016-12-28 NOTE — Telephone Encounter (Signed)
  Follow up Call-  Call back number 12/27/2016  Post procedure Call Back phone  # [phone]  Permission to leave phone message Yes  Some recent data might be hidden     Patient questions:  Do you have a fever, pain , or abdominal swelling? No. Pain Score  0 *  Have you tolerated food without any problems? Yes.    Have you been able to return to your normal activities? Yes.    Do you have any questions about your discharge instructions: Diet   No. Medications  No. Follow up visit  No.  Do you have questions or concerns about your Care? No.  Actions: * If pain score is 4 or above: No action needed, pain <4.

## 2017-05-02 ENCOUNTER — Encounter: Payer: Self-pay | Admitting: Obstetrics and Gynecology

## 2017-05-02 ENCOUNTER — Ambulatory Visit (INDEPENDENT_AMBULATORY_CARE_PROVIDER_SITE_OTHER): Payer: BLUE CROSS/BLUE SHIELD | Admitting: Obstetrics and Gynecology

## 2017-05-02 VITALS — BP 110/72 | HR 72 | Resp 16 | Ht 67.5 in | Wt 149.0 lb

## 2017-05-02 DIAGNOSIS — R1032 Left lower quadrant pain: Secondary | ICD-10-CM

## 2017-05-02 DIAGNOSIS — E559 Vitamin D deficiency, unspecified: Secondary | ICD-10-CM

## 2017-05-02 DIAGNOSIS — Z01419 Encounter for gynecological examination (general) (routine) without abnormal findings: Secondary | ICD-10-CM | POA: Diagnosis not present

## 2017-05-02 DIAGNOSIS — Z0000 Encounter for general adult medical examination without abnormal findings: Secondary | ICD-10-CM | POA: Diagnosis not present

## 2017-05-02 NOTE — Patient Instructions (Signed)

## 2017-05-02 NOTE — Progress Notes (Signed)
51 y.o. [id number] MarriedCaucasianF here for annual exam.   The last 2 cycles were off for her. 3 and 5 weeks as opposed to 4 weeks. Lighter the last 2 cycles.  No BTB, no dyspareunia.  Period Duration (Days): 3 days  Period Pattern: (!) Irregular Menstrual Flow: Light Menstrual Control: Thin pad Dysmenorrhea: None  For the last 3-4 months she has been getting random sharp pains in her LLQ for 1-2 seconds. Not related to cycles, BM, diet, activity. Tolerable, just wants to know what it is. She just had a normal colonoscopy.   Patient's last menstrual period was 04/18/2017.          Sexually active: Yes.    The current method of family planning is vasectomy.    Exercising: Yes.    walking Smoker:  no  Health Maintenance: Pap:  04-03-16 WNL NEG HR HPV 12-04-12 WNL NEG HR HPV  History of abnormal Pap:  no MMG:  05-24-16 WNL Colonoscopy:  12-27-16 WNL repeat in 10 yrs  BMD:   Never TDaP:  04-18-16 Gardasil: N/A   reports that she has never smoked. She has never used smokeless tobacco. She reports that she drinks about 3.0 oz of alcohol per week . 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   Past Medical History:  Diagnosis Date  . Retinitis pigmentosa 2012  . Rosacea 2009    Past Surgical History:  Procedure Laterality Date  . TONSILLECTOMY  1978    Current Outpatient Prescriptions  Medication Sig Dispense Refill  . Calcium Carbonate-Vitamin D (CALCIUM-D PO) Take by mouth.    Marland Kitchen ibuprofen (ADVIL,MOTRIN) 100 MG tablet Take 100 mg by mouth every 6 (six) hours as needed for fever.     Current Facility-Administered Medications  Medication Dose Route Frequency Provider Last Rate Last Dose  . 0.9 %  sodium chloride infusion  500 mL Intravenous Continuous Milus Banister, MD        Family History  Problem Relation Age of Onset  . Hypertension Mother   . Cancer Mother         melanoma on face  . Hypertension Father   . Breast cancer Maternal Aunt   . Heart attack Paternal Grandfather   . Colon cancer Neg Hx     Review of Systems  Constitutional: Negative.   HENT: Negative.   Eyes: Negative.   Respiratory: Negative.   Cardiovascular: Negative.   Gastrointestinal: Negative.   Endocrine: Negative.   Genitourinary:       LLQ pain   Musculoskeletal: Negative.   Skin: Negative.   Allergic/Immunologic: Negative.   Neurological: Negative.   Psychiatric/Behavioral: Negative.     Exam:   BP 110/72 (BP Location: Right Arm, Patient Position: Sitting, Cuff Size: Normal)   Pulse 72   Resp 16   Ht 5' 7.5" (1.715 m)   Wt 149 lb (67.6 kg)   LMP 04/18/2017   BMI 22.99 kg/m   Weight change: @WEIGHTCHANGE @ Height:   Height: 5' 7.5" (171.5 cm)  Ht Readings from Last 3 Encounters:  05/02/17 5' 7.5" (1.715 m)  12/27/16 5\' 8"  (1.727 m)  12/19/16 5\' 8"  (1.727 m)    General appearance: alert, cooperative and appears stated age Head: Normocephalic, without obvious abnormality, atraumatic Neck: no adenopathy, supple, symmetrical, trachea midline and thyroid normal to inspection and palpation Lungs: clear to auscultation bilaterally Cardiovascular: regular rate and rhythm Breasts: normal  appearance, no masses or tenderness Abdomen: soft, non-tender; bowel sounds normal; no masses,  no organomegaly Extremities: extremities normal, atraumatic, no cyanosis or edema Skin: Skin color, texture, turgor normal. No rashes or lesions Lymph nodes: Cervical, supraclavicular, and axillary nodes normal. No abnormal inguinal nodes palpated Neurologic: Grossly normal   Pelvic: External genitalia:  no lesions              Urethra:  normal appearing urethra with no masses, tenderness or lesions              Bartholins and Skenes: normal                 Vagina: normal appearing vagina with normal color and discharge, no lesions              Cervix: no lesions                Bimanual Exam:  Uterus:  normal size, contour, position, consistency, mobility, non-tender and she has a small myoma off to her left, < 3cm, not tender              Adnexa: no mass, fullness, tenderness               Rectovaginal: Confirms               Anus:  normal sphincter tone, no lesions  Chaperone was present for exam.  A:  Well Woman with normal exam  Intermittent, random, tolerable LLQ abdominal pain, normal exam, recent normal colonoscopy  H/O low vit d    P:   No pap this year  Mammogram, she would like to set this up in 6 months  Colonoscopy UTD   Discussed breast self exam  Discussed calcium and vit D intake  Recommended she call with worsening pain, would recommend ultrasound  Return for screening labs

## 2017-06-05 ENCOUNTER — Other Ambulatory Visit: Payer: BLUE CROSS/BLUE SHIELD

## 2017-06-20 ENCOUNTER — Other Ambulatory Visit: Payer: BLUE CROSS/BLUE SHIELD

## 2017-06-26 ENCOUNTER — Other Ambulatory Visit (INDEPENDENT_AMBULATORY_CARE_PROVIDER_SITE_OTHER): Payer: BLUE CROSS/BLUE SHIELD

## 2017-06-26 DIAGNOSIS — E559 Vitamin D deficiency, unspecified: Secondary | ICD-10-CM

## 2017-06-26 DIAGNOSIS — Z0000 Encounter for general adult medical examination without abnormal findings: Secondary | ICD-10-CM

## 2017-06-27 LAB — VITAMIN D 25 HYDROXY (VIT D DEFICIENCY, FRACTURES): VIT D 25 HYDROXY: 33.1 ng/mL (ref 30.0–100.0)

## 2017-06-27 LAB — COMPREHENSIVE METABOLIC PANEL
ALBUMIN: 4.2 g/dL (ref 3.5–5.5)
ALT: 11 IU/L (ref 0–32)
AST: 19 IU/L (ref 0–40)
Albumin/Globulin Ratio: 1.6 (ref 1.2–2.2)
Alkaline Phosphatase: 62 IU/L (ref 39–117)
BILIRUBIN TOTAL: 0.4 mg/dL (ref 0.0–1.2)
BUN / CREAT RATIO: 15 (ref 9–23)
BUN: 15 mg/dL (ref 6–24)
CALCIUM: 9.3 mg/dL (ref 8.7–10.2)
CHLORIDE: 102 mmol/L (ref 96–106)
CO2: 23 mmol/L (ref 20–29)
CREATININE: 1.01 mg/dL — AB (ref 0.57–1.00)
GFR calc Af Amer: 75 mL/min/{1.73_m2} (ref >59)
GFR calc non Af Amer: 65 mL/min/{1.73_m2} (ref >59)
GLUCOSE: 78 mg/dL (ref 65–99)
Globulin, Total: 2.7 g/dL (ref 1.5–4.5)
POTASSIUM: 5.1 mmol/L (ref 3.5–5.2)
Sodium: 139 mmol/L (ref 134–144)
TOTAL PROTEIN: 6.9 g/dL (ref 6.0–8.5)

## 2017-06-27 LAB — LIPID PANEL
CHOLESTEROL TOTAL: 183 mg/dL (ref 100–199)
Chol/HDL Ratio: 3.2 ratio (ref 0.0–4.4)
HDL: 58 mg/dL (ref >39)
LDL CALC: 97 mg/dL (ref 0–99)
Triglycerides: 142 mg/dL (ref 0–149)
VLDL Cholesterol Cal: 28 mg/dL (ref 5–40)

## 2017-06-27 LAB — CBC
HEMOGLOBIN: 14.6 g/dL (ref 11.1–15.9)
Hematocrit: 43.6 % (ref 34.0–46.6)
MCH: 29.5 pg (ref 26.6–33.0)
MCHC: 33.5 g/dL (ref 31.5–35.7)
MCV: 88 fL (ref 79–97)
Platelets: 177 10*3/uL (ref 150–379)
RBC: 4.95 x10E6/uL (ref 3.77–5.28)
RDW: 14.3 % (ref 12.3–15.4)
WBC: 6.5 10*3/uL (ref 3.4–10.8)

## 2017-06-28 ENCOUNTER — Encounter: Payer: Self-pay | Admitting: Obstetrics and Gynecology

## 2017-07-03 ENCOUNTER — Telehealth: Payer: Self-pay | Admitting: *Deleted

## 2017-07-03 NOTE — Telephone Encounter (Signed)
Left message to call regarding lab results -eh 

## 2017-07-03 NOTE — Telephone Encounter (Signed)
Spoke with patient and gave results and recommendations. Patient is going to come in next week for repeat labs -eh

## 2017-07-03 NOTE — Telephone Encounter (Signed)
-----   Message from Salvadore Dom, MD sent at 06/27/2017  5:37 PM EDT ----- Please let the patient know that her creatinine was minimally elevated (kidney function). Please have her hydrate well and just return for a f/u creatinine level (not fasting). The rest of her lab work was normal.

## 2017-07-11 ENCOUNTER — Other Ambulatory Visit (INDEPENDENT_AMBULATORY_CARE_PROVIDER_SITE_OTHER): Payer: BLUE CROSS/BLUE SHIELD

## 2017-07-11 ENCOUNTER — Other Ambulatory Visit: Payer: BLUE CROSS/BLUE SHIELD

## 2017-07-11 ENCOUNTER — Other Ambulatory Visit: Payer: Self-pay

## 2017-07-11 DIAGNOSIS — R7989 Other specified abnormal findings of blood chemistry: Secondary | ICD-10-CM

## 2017-07-11 DIAGNOSIS — R7889 Finding of other specified substances, not normally found in blood: Secondary | ICD-10-CM

## 2017-07-11 NOTE — Progress Notes (Signed)
Order for creatinine level recheck placed as the patient is returning to the office at 2 pm for testing.

## 2017-07-12 LAB — CREATININE, SERUM
Creatinine, Ser: 0.87 mg/dL (ref 0.57–1.00)
GFR calc Af Amer: 90 mL/min/{1.73_m2} (ref >59)
GFR, EST NON AFRICAN AMERICAN: 78 mL/min/{1.73_m2} (ref >59)

## 2017-09-25 ENCOUNTER — Other Ambulatory Visit: Payer: Self-pay | Admitting: Obstetrics and Gynecology

## 2017-09-25 DIAGNOSIS — Z1231 Encounter for screening mammogram for malignant neoplasm of breast: Secondary | ICD-10-CM

## 2017-10-16 ENCOUNTER — Ambulatory Visit
Admission: RE | Admit: 2017-10-16 | Discharge: 2017-10-16 | Disposition: A | Discharge status: Home / Self Care | Payer: BLUE CROSS/BLUE SHIELD | Source: Ambulatory Visit | Attending: Obstetrics and Gynecology | Admitting: Obstetrics and Gynecology

## 2017-10-16 DIAGNOSIS — Z1231 Encounter for screening mammogram for malignant neoplasm of breast: Secondary | ICD-10-CM

## 2018-05-02 NOTE — Progress Notes (Signed)
52 y.o. [id number] MarriedCaucasianF here for annual exam.   Period Cycle (Days): 30 Period Duration (Days): 3 days Period Pattern: Regular Menstrual Flow: Moderate Menstrual Control: Maxi pad Menstrual Control Change Freq (Hours): several times a day Dysmenorrhea: (!) Mild Dysmenorrhea Symptoms: Nausea, Headache  She has recently noticed an increase in nausea and headaches midcycle and prior to her cycle. Always go together. Tylenol helps her headache. She still functions. No vomiting. No visual changes.  No dyspareunia.   Patient's last menstrual period was 04/19/2018.          Sexually active: Yes.    The current method of family planning is vasectomy.    Exercising: Yes.    walking 4-5 times a week Smoker:  no  Health Maintenance: Pap:  04/03/2016 History of abnormal Pap:  no MMG:  10/16/2017 BI-RADS CATEGORY  1: Negative. Follow up in 1 year. Colonoscopy:  12/27/2016 follow up 10 years BMD:  never  TDaP:  2017 Gardasil:never    reports that she has never smoked. She has never used smokeless tobacco. She reports that she drinks about 5.0 standard drinks of alcohol per week. She reports that she does not use drugs. She is a Community education officer. 2 sons, both in college  Past Medical History:  Diagnosis Date  . Retinitis pigmentosa 2012  . Rosacea 2009    Past Surgical History:  Procedure Laterality Date  . TONSILLECTOMY  1978    Current Outpatient Medications  Medication Sig Dispense Refill  . Calcium Carbonate-Vitamin D (CALCIUM-D PO) Take by mouth.    Marland Kitchen ibuprofen (ADVIL,MOTRIN) 100 MG tablet Take 100 mg by mouth every 6 (six) hours as needed for fever.     Current Facility-Administered Medications  Medication Dose Route Frequency Provider Last Rate Last Dose  . 0.9 %  sodium chloride infusion  500 mL Intravenous Continuous Milus Banister, MD        Family History  Problem Relation Age of Onset  . Hypertension Mother   . Cancer Mother        melanoma on face  .  Hypertension Father   . Breast cancer Maternal Aunt   . Heart attack Paternal Grandfather   . Colon cancer Neg Hx     Review of Systems  Constitutional: Negative.   HENT: Negative.   Eyes: Negative.   Respiratory: Negative.   Cardiovascular: Negative.   Gastrointestinal: Negative.   Endocrine: Negative.   Genitourinary: Negative.   Musculoskeletal: Negative.   Skin: Negative.   Allergic/Immunologic: Negative.   Neurological: Negative.   Hematological: Negative.   Psychiatric/Behavioral: Negative.   All other systems reviewed and are negative.   Exam:   BP 120/72   Ht 5' 7.5" (1.715 m)   Wt 148 lb (67.1 kg)   LMP 04/19/2018   BMI 22.84 kg/m   Weight change: @WEIGHTCHANGE @ Height:   Height: 5' 7.5" (171.5 cm)  Ht Readings from Last 3 Encounters:  05/06/18 5' 7.5" (1.715 m)  05/02/17 5' 7.5" (1.715 m)  12/27/16 5\' 8"  (1.727 m)    General appearance: alert, cooperative and appears stated age Head: Normocephalic, without obvious abnormality, atraumatic Neck: no adenopathy, supple, symmetrical, trachea midline and thyroid normal to inspection and palpation Lungs: clear to auscultation bilaterally Cardiovascular: regular rate and rhythm Breasts: normal appearance, no masses or tenderness Abdomen: soft, non-tender; non distended,  no masses,  no organomegaly Extremities: extremities normal, atraumatic, no cyanosis or edema Skin: Skin color, texture, turgor normal. No rashes or lesions Lymph nodes: Cervical,  supraclavicular, and axillary nodes normal. No abnormal inguinal nodes palpated Neurologic: Grossly normal   Pelvic: External genitalia:  no lesions              Urethra:  normal appearing urethra with no masses, tenderness or lesions              Bartholins and Skenes: normal                 Vagina: normal appearing vagina with normal color and discharge, no lesions              Cervix: no lesions               Bimanual Exam:  Uterus:  normal size, contour,  position, consistency, mobility, non-tender              Adnexa: no mass, fullness, tenderness               Rectovaginal: Confirms               Anus:  normal sphincter tone, no lesions  Chaperone was present for exam.  A:  Well Woman with normal exam  H/O low vit d  P:   Discussed breast self exam  Discussed calcium and vit D intake  Pap next year  Mammogram and colonoscopy UTD  Return for fasting labs, vit d

## 2018-05-06 ENCOUNTER — Encounter: Payer: Self-pay | Admitting: Obstetrics and Gynecology

## 2018-05-06 ENCOUNTER — Ambulatory Visit (INDEPENDENT_AMBULATORY_CARE_PROVIDER_SITE_OTHER): Payer: BLUE CROSS/BLUE SHIELD | Admitting: Obstetrics and Gynecology

## 2018-05-06 VITALS — BP 120/72 | Ht 67.5 in | Wt 148.0 lb

## 2018-05-06 DIAGNOSIS — E559 Vitamin D deficiency, unspecified: Secondary | ICD-10-CM | POA: Diagnosis not present

## 2018-05-06 DIAGNOSIS — Z0000 Encounter for general adult medical examination without abnormal findings: Secondary | ICD-10-CM

## 2018-05-06 DIAGNOSIS — Z01419 Encounter for gynecological examination (general) (routine) without abnormal findings: Secondary | ICD-10-CM | POA: Diagnosis not present

## 2018-05-06 NOTE — Patient Instructions (Signed)
EXERCISE AND DIET:  We recommended that you start or continue a regular exercise program for good health. Regular exercise means any activity that makes your heart beat faster and makes you sweat.  We recommend exercising at least 30 minutes per day at least 3 days a week, preferably 4 or 5.  We also recommend a diet low in fat and sugar.  Inactivity, poor dietary choices and obesity can cause diabetes, heart attack, stroke, and kidney damage, among others.    ALCOHOL AND SMOKING:  Women should limit their alcohol intake to no more than 7 drinks/beers/glasses of wine (combined, not each!) per week. Moderation of alcohol intake to this level decreases your risk of breast cancer and liver damage. And of course, no recreational drugs are part of a healthy lifestyle.  And absolutely no smoking or even second hand smoke. Most people know smoking can cause heart and lung diseases, but did you know it also contributes to weakening of your bones? Aging of your skin?  Yellowing of your teeth and nails?  CALCIUM AND VITAMIN D:  Adequate intake of calcium and Vitamin D are recommended.  The recommendations for exact amounts of these supplements seem to change often, but generally speaking 1,000 mg of calcium (either carbonate or citrate) and 800 units of Vitamin D per day seems prudent. Certain women may benefit from higher intake of Vitamin D.  If you are among these women, your doctor will have told you during your visit.    PAP SMEARS:  Pap smears, to check for cervical cancer or precancers,  have traditionally been done yearly, although recent scientific advances have shown that most women can have pap smears less often.  However, every woman still should have a physical exam from her gynecologist every year. It will include a breast check, inspection of the vulva and vagina to check for abnormal growths or skin changes, a visual exam of the cervix, and then an exam to evaluate the size and shape of the uterus and  ovaries.  And after 51 years of age, a rectal exam is indicated to check for rectal cancers. We will also provide age appropriate advice regarding health maintenance, like when you should have certain vaccines, screening for sexually transmitted diseases, bone density testing, colonoscopy, mammograms, etc.   MAMMOGRAMS:  All women over 40 years old should have a yearly mammogram. Many facilities now offer a "3D" mammogram, which may cost around $50 extra out of pocket. If possible,  we recommend you accept the option to have the 3D mammogram performed.  It both reduces the number of women who will be called back for extra views which then turn out to be normal, and it is better than the routine mammogram at detecting truly abnormal areas.    COLONOSCOPY:  Colonoscopy to screen for colon cancer is recommended for all women at age 50.  We know, you hate the idea of the prep.  We agree, BUT, having colon cancer and not knowing it is worse!!  Colon cancer so often starts as a polyp that can be seen and removed at colonscopy, which can quite literally save your life!  And if your first colonoscopy is normal and you have no family history of colon cancer, most women don't have to have it again for 10 years.  Once every ten years, you can do something that may end up saving your life, right?  We will be happy to help you get it scheduled when you are ready.    Be sure to check your insurance coverage so you understand how much it will cost.  It may be covered as a preventative service at no cost, but you should check your particular policy.      Breast Self-Awareness Breast self-awareness means being familiar with how your breasts look and feel. It involves checking your breasts regularly and reporting any changes to your health care provider. Practicing breast self-awareness is important. A change in your breasts can be a sign of a serious medical problem. Being familiar with how your breasts look and feel allows  you to find any problems early, when treatment is more likely to be successful. All women should practice breast self-awareness, including women who have had breast implants. How to do a breast self-exam One way to learn what is normal for your breasts and whether your breasts are changing is to do a breast self-exam. To do a breast self-exam: Look for Changes  1. Remove all the clothing above your waist. 2. Stand in front of a mirror in a room with good lighting. 3. Put your hands on your hips. 4. Push your hands firmly downward. 5. Compare your breasts in the mirror. Look for differences between them (asymmetry), such as: ? Differences in shape. ? Differences in size. ? Puckers, dips, and bumps in one breast and not the other. 6. Look at each breast for changes in your skin, such as: ? Redness. ? Scaly areas. 7. Look for changes in your nipples, such as: ? Discharge. ? Bleeding. ? Dimpling. ? Redness. ? A change in position. Feel for Changes  Carefully feel your breasts for lumps and changes. It is best to do this while lying on your back on the floor and again while sitting or standing in the shower or tub with soapy water on your skin. Feel each breast in the following way:  Place the arm on the side of the breast you are examining above your head.  Feel your breast with the other hand.  Start in the nipple area and make  inch (2 cm) overlapping circles to feel your breast. Use the pads of your three middle fingers to do this. Apply light pressure, then medium pressure, then firm pressure. The light pressure will allow you to feel the tissue closest to the skin. The medium pressure will allow you to feel the tissue that is a little deeper. The firm pressure will allow you to feel the tissue close to the ribs.  Continue the overlapping circles, moving downward over the breast until you feel your ribs below your breast.  Move one finger-width toward the center of the body.  Continue to use the  inch (2 cm) overlapping circles to feel your breast as you move slowly up toward your collarbone.  Continue the up and down exam using all three pressures until you reach your armpit.  Write Down What You Find  Write down what is normal for each breast and any changes that you find. Keep a written record with breast changes or normal findings for each breast. By writing this information down, you do not need to depend only on memory for size, tenderness, or location. Write down where you are in your menstrual cycle, if you are still menstruating. If you are having trouble noticing differences in your breasts, do not get discouraged. With time you will become more familiar with the variations in your breasts and more comfortable with the exam. How often should I examine my breasts? Examine   your breasts every month. If you are breastfeeding, the best time to examine your breasts is after a feeding or after using a breast pump. If you menstruate, the best time to examine your breasts is 5-7 days after your period is over. During your period, your breasts are lumpier, and it may be more difficult to notice changes. When should I see my health care provider? See your health care provider if you notice:  A change in shape or size of your breasts or nipples.  A change in the skin of your breast or nipples, such as a reddened or scaly area.  Unusual discharge from your nipples.  A lump or thick area that was not there before.  Pain in your breasts.  Anything that concerns you.  This information is not intended to replace advice given to you by your health care provider. Make sure you discuss any questions you have with your health care provider. Document Released: 08/14/2005 Document Revised: 01/20/2016 Document Reviewed: 07/04/2015 Elsevier Interactive Patient Education  2018 [employer]  

## 2018-06-07 ENCOUNTER — Other Ambulatory Visit (INDEPENDENT_AMBULATORY_CARE_PROVIDER_SITE_OTHER): Payer: BLUE CROSS/BLUE SHIELD

## 2018-06-07 DIAGNOSIS — Z0000 Encounter for general adult medical examination without abnormal findings: Secondary | ICD-10-CM

## 2018-06-07 DIAGNOSIS — E559 Vitamin D deficiency, unspecified: Secondary | ICD-10-CM

## 2018-06-08 LAB — LIPID PANEL
CHOLESTEROL TOTAL: 201 mg/dL — AB (ref 100–199)
Chol/HDL Ratio: 2.9 ratio (ref 0.0–4.4)
HDL: 69 mg/dL (ref >39)
LDL CALC: 114 mg/dL — AB (ref 0–99)
TRIGLYCERIDES: 91 mg/dL (ref 0–149)
VLDL CHOLESTEROL CAL: 18 mg/dL (ref 5–40)

## 2018-06-08 LAB — COMPREHENSIVE METABOLIC PANEL WITH GFR
ALT: 11 IU/L (ref 0–32)
AST: 20 IU/L (ref 0–40)
Albumin/Globulin Ratio: 1.5 (ref 1.2–2.2)
Albumin: 4 g/dL (ref 3.5–5.5)
Alkaline Phosphatase: 66 IU/L (ref 39–117)
BUN/Creatinine Ratio: 11 (ref 9–23)
BUN: 10 mg/dL (ref 6–24)
Bilirubin Total: 0.5 mg/dL (ref 0.0–1.2)
CO2: 24 mmol/L (ref 20–29)
Calcium: 8.8 mg/dL (ref 8.7–10.2)
Chloride: 101 mmol/L (ref 96–106)
Creatinine, Ser: 0.88 mg/dL (ref 0.57–1.00)
GFR calc Af Amer: 88 mL/min/1.73
GFR calc non Af Amer: 76 mL/min/1.73
Globulin, Total: 2.7 g/dL (ref 1.5–4.5)
Glucose: 90 mg/dL (ref 65–99)
Potassium: 4.2 mmol/L (ref 3.5–5.2)
Sodium: 139 mmol/L (ref 134–144)
Total Protein: 6.7 g/dL (ref 6.0–8.5)

## 2018-06-08 LAB — CBC
HEMATOCRIT: 40.4 % (ref 34.0–46.6)
HEMOGLOBIN: 12.5 g/dL (ref 11.1–15.9)
MCH: 26.5 pg — ABNORMAL LOW (ref 26.6–33.0)
MCHC: 30.9 g/dL — AB (ref 31.5–35.7)
MCV: 86 fL (ref 79–97)
Platelets: 219 10*3/uL (ref 150–450)
RBC: 4.72 x10E6/uL (ref 3.77–5.28)
RDW: 13 % (ref 12.3–15.4)
WBC: 5.7 10*3/uL (ref 3.4–10.8)

## 2018-06-08 LAB — VITAMIN D 25 HYDROXY (VIT D DEFICIENCY, FRACTURES): Vit D, 25-Hydroxy: 35.6 ng/mL (ref 30.0–100.0)

## 2018-07-19 ENCOUNTER — Ambulatory Visit: Payer: BLUE CROSS/BLUE SHIELD | Admitting: Certified Nurse Midwife

## 2018-07-19 ENCOUNTER — Encounter: Payer: Self-pay | Admitting: Certified Nurse Midwife

## 2018-07-19 ENCOUNTER — Other Ambulatory Visit: Payer: Self-pay

## 2018-07-19 VITALS — BP 118/78 | HR 70 | Temp 98.1°F | Resp 16 | Wt 152.0 lb

## 2018-07-19 DIAGNOSIS — R319 Hematuria, unspecified: Secondary | ICD-10-CM

## 2018-07-19 DIAGNOSIS — N390 Urinary tract infection, site not specified: Secondary | ICD-10-CM | POA: Diagnosis not present

## 2018-07-19 LAB — POCT URINALYSIS DIPSTICK
BILIRUBIN UA: NEGATIVE
GLUCOSE UA: NEGATIVE
KETONES UA: NEGATIVE
Nitrite, UA: NEGATIVE
PH UA: 5 (ref 5.0–8.0)
Protein, UA: POSITIVE — AB
Urobilinogen, UA: NEGATIVE E.U./dL — AB

## 2018-07-19 MED ORDER — PHENAZOPYRIDINE HCL 100 MG PO TABS: 100.0000 mg | ORAL_TABLET | Freq: Two times a day (BID) | ORAL | 0 refills | Status: DC

## 2018-07-19 MED ORDER — NITROFURANTOIN MONOHYD MACRO 100 MG PO CAPS: 100.0000 mg | ORAL_CAPSULE | Freq: Two times a day (BID) | ORAL | 0 refills | Status: DC

## 2018-07-19 NOTE — Progress Notes (Signed)
52 y.o. Married Caucasian female [id number] here with complaint of UTI, with onset on in last 24 hours.. Patient complaining of urinary urgency and noted blood in toliet and pain with urination. Has also had some slight bloating. Patient denies fever, chills, nausea or back pain. No new personal products. Patient feels not related to sexual activity. Denies any vaginal symptoms.   Contraception is Spouse vasectomy. Patient consuming adequate water intake and no sodas. No other health issues today.  Review of Systems  Constitutional: Negative.   HENT: Negative.   Eyes: Negative.   Respiratory: Negative.   Cardiovascular: Negative.   Gastrointestinal: Negative.   Genitourinary: Positive for hematuria and urgency. Negative for dysuria and flank pain.  Skin: Negative.   Neurological: Negative.   Psychiatric/Behavioral: Negative.     O: Healthy female WDWN Affect: Normal, orientation x 3 Skin : warm and dry CVAT: negative bilateral Abdomen: positive for suprapubic tenderness  Pelvic exam: External genital area: normal, no lesions Bladder,Urethra tender, Urethral meatus: tender, red Vagina: normal vaginal discharge, normal appearance   Cervix: normal, non tender Uterus:normal,non tender Adnexa: normal non tender, no fullness or masses   A: UTI Normal pelvic exam poct urine-wbc 2+, rbc 2+, protein tr  P: Reviewed findings of UTI and need for treatment. [id number] see order with instructions Rx Pyridium see order with instructions [id number] micro, culture Reviewed warning signs and symptoms of UTI and need to advise if occurring. Encouraged to limit soda, tea, and coffee and be sure to increase water intake. Questions addressed.  RV prn

## 2018-07-19 NOTE — Patient Instructions (Signed)

## 2018-07-20 LAB — URINALYSIS, MICROSCOPIC ONLY
Casts: NONE SEEN /lpf
RBC, UA: >30 /hpf — AB (ref 0–2)

## 2018-07-21 LAB — URINE CULTURE

## 2018-07-23 ENCOUNTER — Telehealth: Payer: Self-pay

## 2018-07-23 NOTE — Telephone Encounter (Signed)
Patient is returning a call to Joy. °

## 2018-07-23 NOTE — Telephone Encounter (Signed)
Patient notified of results. See lab 

## 2018-07-23 NOTE — Telephone Encounter (Signed)
Left message for call back.

## 2018-07-23 NOTE — Telephone Encounter (Signed)
-----   Message from Regina Eck, CNM sent at 07/22/2018  7:26 PM EST ----- Nurse visit for micro urine unless patient still symptomatic

## 2018-08-06 ENCOUNTER — Ambulatory Visit: Payer: BLUE CROSS/BLUE SHIELD

## 2018-08-08 ENCOUNTER — Telehealth: Payer: Self-pay | Admitting: Obstetrics and Gynecology

## 2018-08-08 NOTE — Telephone Encounter (Signed)
Patient canceled nurse visit that was scheduled for tomorrow (08/09/18). Patient stated that she would call back next week to reschedule.  Routing to Dr. Talbert Nan for Bruce.

## 2018-08-09 ENCOUNTER — Ambulatory Visit: Payer: BLUE CROSS/BLUE SHIELD

## 2018-08-12 ENCOUNTER — Ambulatory Visit: Payer: BLUE CROSS/BLUE SHIELD

## 2018-10-21 ENCOUNTER — Other Ambulatory Visit: Payer: Self-pay | Admitting: Obstetrics and Gynecology

## 2018-10-21 DIAGNOSIS — Z1231 Encounter for screening mammogram for malignant neoplasm of breast: Secondary | ICD-10-CM

## 2018-11-28 ENCOUNTER — Ambulatory Visit: Payer: BLUE CROSS/BLUE SHIELD

## 2019-01-14 ENCOUNTER — Other Ambulatory Visit: Payer: Self-pay

## 2019-01-14 ENCOUNTER — Ambulatory Visit
Admission: RE | Admit: 2019-01-14 | Discharge: 2019-01-14 | Disposition: A | Discharge status: Home / Self Care | Payer: BLUE CROSS/BLUE SHIELD | Source: Ambulatory Visit | Attending: Obstetrics and Gynecology | Admitting: Obstetrics and Gynecology

## 2019-01-14 DIAGNOSIS — Z1231 Encounter for screening mammogram for malignant neoplasm of breast: Secondary | ICD-10-CM

## 2019-01-15 ENCOUNTER — Other Ambulatory Visit: Payer: Self-pay | Admitting: Obstetrics and Gynecology

## 2019-01-15 DIAGNOSIS — R928 Other abnormal and inconclusive findings on diagnostic imaging of breast: Secondary | ICD-10-CM

## 2019-01-24 ENCOUNTER — Ambulatory Visit
Admission: RE | Admit: 2019-01-24 | Discharge: 2019-01-24 | Disposition: A | Discharge status: Home / Self Care | Payer: BLUE CROSS/BLUE SHIELD | Source: Ambulatory Visit | Attending: Obstetrics and Gynecology | Admitting: Obstetrics and Gynecology

## 2019-01-24 ENCOUNTER — Other Ambulatory Visit: Payer: Self-pay

## 2019-01-24 DIAGNOSIS — R928 Other abnormal and inconclusive findings on diagnostic imaging of breast: Secondary | ICD-10-CM

## 2019-05-14 ENCOUNTER — Ambulatory Visit: Payer: BLUE CROSS/BLUE SHIELD | Admitting: Obstetrics and Gynecology

## 2019-05-20 NOTE — Progress Notes (Signed)
53 y.o. G57P2002 Married White or Caucasian Not Hispanic or Latino female here for annual exam.    Cycles are lasting longer, still very regular  Period Cycle (Days): 28 Period Duration (Days): 7-9 days Period Pattern: Regular Menstrual Flow: Light, Moderate Menstrual Control: Thin pad, Panty liner Menstrual Control Change Freq (Hours): changes pad every 8 hours Dysmenorrhea: None  She has had one hot flashes, hotter at night, not sweating.  Sexually active, no pain, slight dryness.   Patient's last menstrual period was 05/01/2019 (exact date).          Sexually active: Yes.    The current method of family planning is spouse with vasectomy.    Exercising: Yes.    walking 2-4 miles, push ups Smoker:  no  Health Maintenance: Pap:  04/03/2016 WNL NEG HPV History of abnormal Pap:  no MMG:  01/14/2019 Birads 0, 01/24/2019 left breast ultrasound and diagnostic Birads 2 benign Colonoscopy:  12/27/2016 follow up 10 years BMD:  never  TDaP:  2017 Gardasil:never    reports that she has never smoked. She has never used smokeless tobacco. She reports current alcohol use of about 5.0 standard drinks of alcohol per week. She reports that she does not use drugs. She is a Community education officer, working part time. 2 sons, one in college Web designer) trying to get into dental school, other son getting his MBA.   Past Medical History:  Diagnosis Date  . Retinitis pigmentosa 2012  . Rosacea 2009    Past Surgical History:  Procedure Laterality Date  . TONSILLECTOMY  1978    Current Outpatient Medications  Medication Sig Dispense Refill  . Calcium Carbonate-Vitamin D (CALCIUM-D PO) Take by mouth.    Marland Kitchen ibuprofen (ADVIL,MOTRIN) 100 MG tablet Take 100 mg by mouth every 6 (six) hours as needed for fever.     No current facility-administered medications for this visit.     Family History  Problem Relation Age of Onset  . Hypertension Mother   . Cancer Mother        melanoma on face  . Hypertension  Father   . Breast cancer Maternal Aunt   . Heart attack Paternal Grandfather   . Colon cancer Neg Hx     Review of Systems  Constitutional: Negative.   HENT: Negative.   Eyes: Negative.   Respiratory: Negative.   Cardiovascular: Negative.   Gastrointestinal: Negative.   Endocrine: Negative.   Genitourinary: Positive for menstrual problem.  Musculoskeletal: Negative.   Skin: Negative.   Allergic/Immunologic: Negative.   Neurological: Negative.   Psychiatric/Behavioral: Negative.     Exam:   BP 108/62 (BP Location: Right Arm, Patient Position: Sitting, Cuff Size: Normal)   Pulse 80   Temp (!) 97.2 F (36.2 C) (Skin)   Wt 153 lb 3.2 oz (69.5 kg)   LMP 05/01/2019 (Exact Date)   BMI 23.64 kg/m   Weight change: @WEIGHTCHANGE @ Height:      Ht Readings from Last 3 Encounters:  05/06/18 5' 7.5" (1.715 m)  05/02/17 5' 7.5" (1.715 m)  12/27/16 5\' 8"  (1.727 m)    General appearance: alert, cooperative and appears stated age Head: Normocephalic, without obvious abnormality, atraumatic Neck: no adenopathy, supple, symmetrical, trachea midline and thyroid normal to inspection and palpation Lungs: clear to auscultation bilaterally Cardiovascular: regular rate and rhythm Breasts: normal appearance, no masses or tenderness Abdomen: soft, non-tender; non distended,  no masses,  no organomegaly Extremities: extremities normal, atraumatic, no cyanosis or edema Skin: Skin color, texture, turgor normal.  No rashes or lesions Lymph nodes: Cervical, supraclavicular, and axillary nodes normal. No abnormal inguinal nodes palpated Neurologic: Grossly normal   Pelvic: External genitalia:  no lesions              Urethra:  normal appearing urethra with no masses, tenderness or lesions              Bartholins and Skenes: normal                 Vagina: normal appearing vagina with normal color and discharge, no lesions              Cervix: no lesions               Bimanual Exam:  Uterus:   normal size, contour, position, consistency, mobility, non-tender              Adnexa: no mass, fullness, tenderness               Rectovaginal: Confirms               Anus:  normal sphincter tone, no lesions  Chaperone was present for exam.  A:  Well Woman with normal exam  H/O vit d def  Some cycle changes  P:   No pap this year  Mammogram UTD  Colonoscopy UTD  Return for fasting labs  Discussed breast self exam  Discussed calcium and vit D intake  Calender her cycle, call if consistently bleeding for 8-9 days a month, if she is skipping cycles or with any other concerns

## 2019-05-21 ENCOUNTER — Encounter: Payer: Self-pay | Admitting: Obstetrics and Gynecology

## 2019-05-21 ENCOUNTER — Ambulatory Visit: Payer: BC Managed Care – PPO | Admitting: Obstetrics and Gynecology

## 2019-05-21 ENCOUNTER — Other Ambulatory Visit: Payer: Self-pay

## 2019-05-21 VITALS — BP 108/62 | HR 80 | Temp 97.2°F | Wt 153.2 lb

## 2019-05-21 DIAGNOSIS — Z0000 Encounter for general adult medical examination without abnormal findings: Secondary | ICD-10-CM | POA: Diagnosis not present

## 2019-05-21 DIAGNOSIS — Z01419 Encounter for gynecological examination (general) (routine) without abnormal findings: Secondary | ICD-10-CM

## 2019-05-21 DIAGNOSIS — E559 Vitamin D deficiency, unspecified: Secondary | ICD-10-CM

## 2019-05-21 NOTE — Patient Instructions (Signed)
EXERCISE AND DIET:  We recommended that you start or continue a regular exercise program for good health. Regular exercise means any activity that makes your heart beat faster and makes you sweat.  We recommend exercising at least 30 minutes per day at least 3 days a week, preferably 4 or 5.  We also recommend a diet low in fat and sugar.  Inactivity, poor dietary choices and obesity can cause diabetes, heart attack, stroke, and kidney damage, among others.    ALCOHOL AND SMOKING:  Women should limit their alcohol intake to no more than 7 drinks/beers/glasses of wine (combined, not each!) per week. Moderation of alcohol intake to this level decreases your risk of breast cancer and liver damage. And of course, no recreational drugs are part of a healthy lifestyle.  And absolutely no smoking or even second hand smoke. Most people know smoking can cause heart and lung diseases, but did you know it also contributes to weakening of your bones? Aging of your skin?  Yellowing of your teeth and nails?  CALCIUM AND VITAMIN D:  Adequate intake of calcium and Vitamin D are recommended.  The recommendations for exact amounts of these supplements seem to change often, but generally speaking 1,000 mg of calcium (between diet and supplement) and 800 units of Vitamin D per day seems prudent. Certain women may benefit from higher intake of Vitamin D.  If you are among these women, your doctor will have told you during your visit.    PAP SMEARS:  Pap smears, to check for cervical cancer or precancers,  have traditionally been done yearly, although recent scientific advances have shown that most women can have pap smears less often.  However, every woman still should have a physical exam from her gynecologist every year. It will include a breast check, inspection of the vulva and vagina to check for abnormal growths or skin changes, a visual exam of the cervix, and then an exam to evaluate the size and shape of the uterus and  ovaries.  And after 52 years of age, a rectal exam is indicated to check for rectal cancers. We will also provide age appropriate advice regarding health maintenance, like when you should have certain vaccines, screening for sexually transmitted diseases, bone density testing, colonoscopy, mammograms, etc.   MAMMOGRAMS:  All women over 40 years old should have a yearly mammogram. Many facilities now offer a "3D" mammogram, which may cost around $50 extra out of pocket. If possible,  we recommend you accept the option to have the 3D mammogram performed.  It both reduces the number of women who will be called back for extra views which then turn out to be normal, and it is better than the routine mammogram at detecting truly abnormal areas.    COLON CANCER SCREENING: Now recommend starting at age 45. At this time colonoscopy is not covered for routine screening until 50. There are take home tests that can be done between 45-49.   COLONOSCOPY:  Colonoscopy to screen for colon cancer is recommended for all women at age 50.  We know, you hate the idea of the prep.  We agree, BUT, having colon cancer and not knowing it is worse!!  Colon cancer so often starts as a polyp that can be seen and removed at colonscopy, which can quite literally save your life!  And if your first colonoscopy is normal and you have no family history of colon cancer, most women don't have to have it again for   10 years.  Once every ten years, you can do something that may end up saving your life, right?  We will be happy to help you get it scheduled when you are ready.  Be sure to check your insurance coverage so you understand how much it will cost.  It may be covered as a preventative service at no cost, but you should check your particular policy.      Breast Self-Awareness Breast self-awareness means being familiar with how your breasts look and feel. It involves checking your breasts regularly and reporting any changes to your  health care provider. Practicing breast self-awareness is important. A change in your breasts can be a sign of a serious medical problem. Being familiar with how your breasts look and feel allows you to find any problems early, when treatment is more likely to be successful. All women should practice breast self-awareness, including women who have had breast implants. How to do a breast self-exam One way to learn what is normal for your breasts and whether your breasts are changing is to do a breast self-exam. To do a breast self-exam: Look for Changes  1. Remove all the clothing above your waist. 2. Stand in front of a mirror in a room with good lighting. 3. Put your hands on your hips. 4. Push your hands firmly downward. 5. Compare your breasts in the mirror. Look for differences between them (asymmetry), such as: ? Differences in shape. ? Differences in size. ? Puckers, dips, and bumps in one breast and not the other. 6. Look at each breast for changes in your skin, such as: ? Redness. ? Scaly areas. 7. Look for changes in your nipples, such as: ? Discharge. ? Bleeding. ? Dimpling. ? Redness. ? A change in position. Feel for Changes Carefully feel your breasts for lumps and changes. It is best to do this while lying on your back on the floor and again while sitting or standing in the shower or tub with soapy water on your skin. Feel each breast in the following way:  Place the arm on the side of the breast you are examining above your head.  Feel your breast with the other hand.  Start in the nipple area and make  inch (2 cm) overlapping circles to feel your breast. Use the pads of your three middle fingers to do this. Apply light pressure, then medium pressure, then firm pressure. The light pressure will allow you to feel the tissue closest to the skin. The medium pressure will allow you to feel the tissue that is a little deeper. The firm pressure will allow you to feel the tissue  close to the ribs.  Continue the overlapping circles, moving downward over the breast until you feel your ribs below your breast.  Move one finger-width toward the center of the body. Continue to use the  inch (2 cm) overlapping circles to feel your breast as you move slowly up toward your collarbone.  Continue the up and down exam using all three pressures until you reach your armpit.  Write Down What You Find  Write down what is normal for each breast and any changes that you find. Keep a written record with breast changes or normal findings for each breast. By writing this information down, you do not need to depend only on memory for size, tenderness, or location. Write down where you are in your menstrual cycle, if you are still menstruating. If you are having trouble noticing differences   in your breasts, do not get discouraged. With time you will become more familiar with the variations in your breasts and more comfortable with the exam. How often should I examine my breasts? Examine your breasts every month. If you are breastfeeding, the best time to examine your breasts is after a feeding or after using a breast pump. If you menstruate, the best time to examine your breasts is 5-7 days after your period is over. During your period, your breasts are lumpier, and it may be more difficult to notice changes. When should I see my health care provider? See your health care provider if you notice:  A change in shape or size of your breasts or nipples.  A change in the skin of your breast or nipples, such as a reddened or scaly area.  Unusual discharge from your nipples.  A lump or thick area that was not there before.  Pain in your breasts.  Anything that concerns you.  

## 2019-05-29 ENCOUNTER — Other Ambulatory Visit: Payer: Self-pay

## 2019-05-29 ENCOUNTER — Other Ambulatory Visit (INDEPENDENT_AMBULATORY_CARE_PROVIDER_SITE_OTHER): Payer: BC Managed Care – PPO

## 2019-05-29 DIAGNOSIS — Z0000 Encounter for general adult medical examination without abnormal findings: Secondary | ICD-10-CM

## 2019-05-29 DIAGNOSIS — E559 Vitamin D deficiency, unspecified: Secondary | ICD-10-CM

## 2019-05-30 LAB — CBC
Hematocrit: 40.5 % (ref 34.0–46.6)
Hemoglobin: 13.2 g/dL (ref 11.1–15.9)
MCH: 27.6 pg (ref 26.6–33.0)
MCHC: 32.6 g/dL (ref 31.5–35.7)
MCV: 85 fL (ref 79–97)
Platelets: 182 10*3/uL (ref 150–450)
RBC: 4.79 x10E6/uL (ref 3.77–5.28)
RDW: 13.9 % (ref 11.7–15.4)
WBC: 4.5 10*3/uL (ref 3.4–10.8)

## 2019-05-30 LAB — COMPREHENSIVE METABOLIC PANEL
ALT: 13 IU/L (ref 0–32)
AST: 22 IU/L (ref 0–40)
Albumin/Globulin Ratio: 1.5 (ref 1.2–2.2)
Albumin: 3.8 g/dL (ref 3.8–4.9)
Alkaline Phosphatase: 70 IU/L (ref 39–117)
BUN/Creatinine Ratio: 14 (ref 9–23)
BUN: 12 mg/dL (ref 6–24)
Bilirubin Total: 0.4 mg/dL (ref 0.0–1.2)
CO2: 23 mmol/L (ref 20–29)
Calcium: 9.1 mg/dL (ref 8.7–10.2)
Chloride: 103 mmol/L (ref 96–106)
Creatinine, Ser: 0.85 mg/dL (ref 0.57–1.00)
GFR calc Af Amer: 91 mL/min/{1.73_m2} (ref >59)
GFR calc non Af Amer: 79 mL/min/{1.73_m2} (ref >59)
Globulin, Total: 2.6 g/dL (ref 1.5–4.5)
Glucose: 89 mg/dL (ref 65–99)
Potassium: 4.1 mmol/L (ref 3.5–5.2)
Sodium: 139 mmol/L (ref 134–144)
Total Protein: 6.4 g/dL (ref 6.0–8.5)

## 2019-05-30 LAB — LIPID PANEL
Chol/HDL Ratio: 2.4 ratio (ref 0.0–4.4)
Cholesterol, Total: 185 mg/dL (ref 100–199)
HDL: 76 mg/dL (ref >39)
LDL Chol Calc (NIH): 96 mg/dL (ref 0–99)
Triglycerides: 71 mg/dL (ref 0–149)
VLDL Cholesterol Cal: 13 mg/dL (ref 5–40)

## 2019-05-30 LAB — VITAMIN D 25 HYDROXY (VIT D DEFICIENCY, FRACTURES): Vit D, 25-Hydroxy: 32.4 ng/mL (ref 30.0–100.0)

## 2019-10-09 IMAGING — MG DIGITAL SCREENING BILATERAL MAMMOGRAM WITH TOMO AND CAD
8 series · 9 of 24 positions shown · non-contrast
Comparison: Previous exam(s).

CLINICAL DATA: Screening.

EXAM:
DIGITAL SCREENING BILATERAL MAMMOGRAM WITH TOMO AND CAD

[R MLO synth-2D]
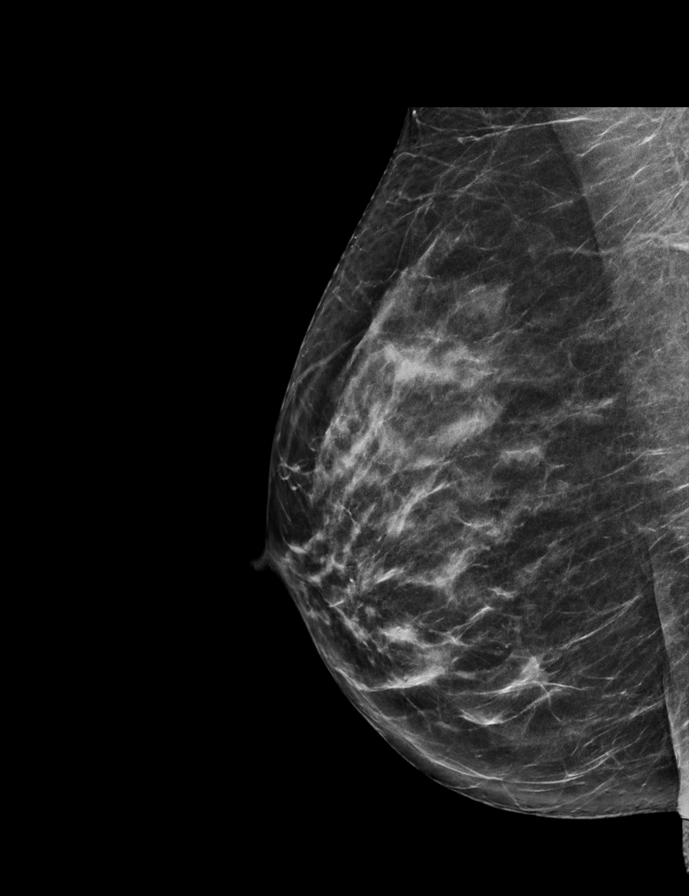

[L MLO synth-2D]
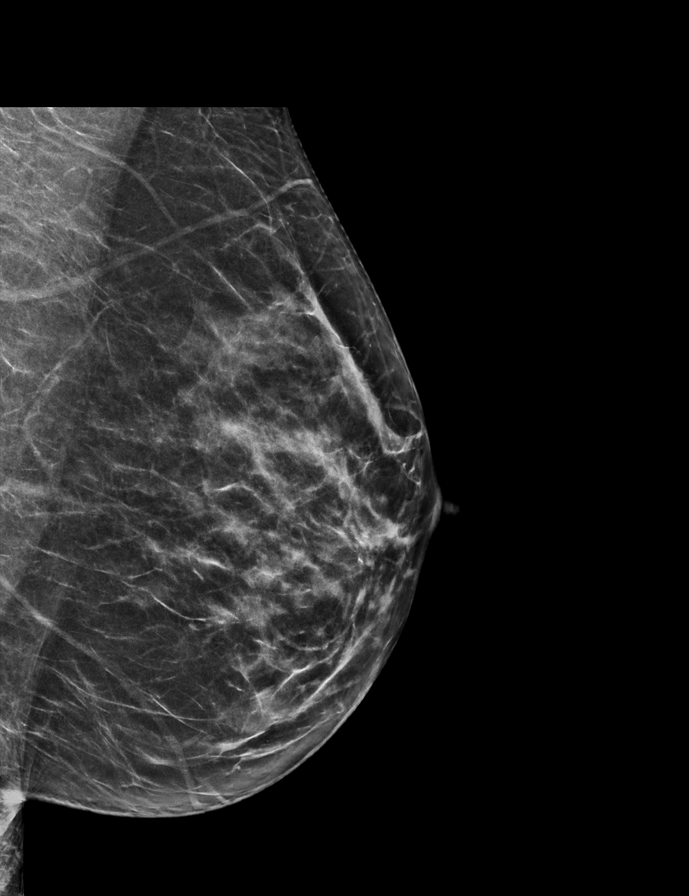

[L CC synth-2D]
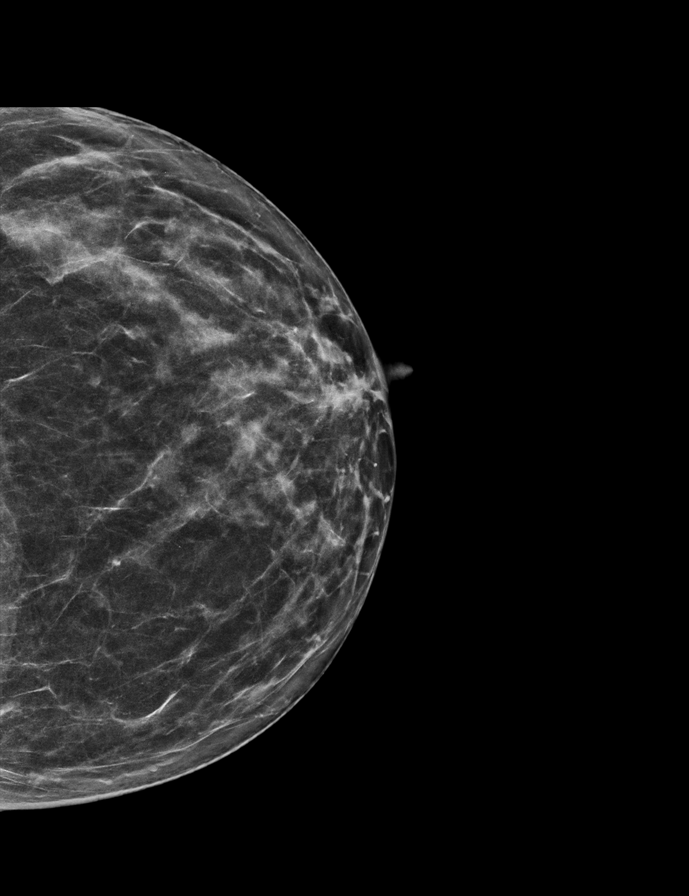

[R CC synth-2D]
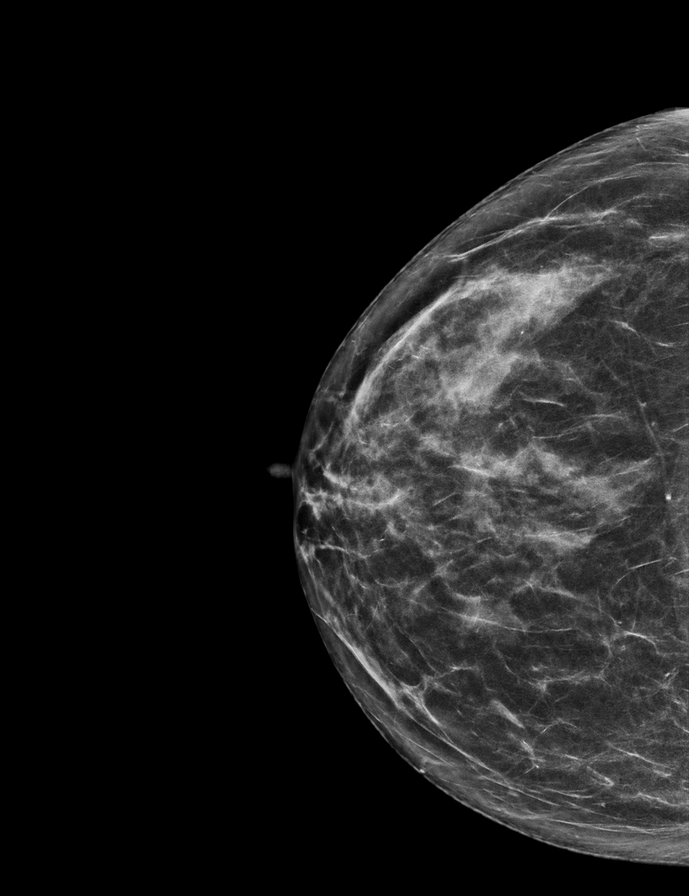

[R MLO tomo · 2 of 62 frames shown]
[frame 21/62]
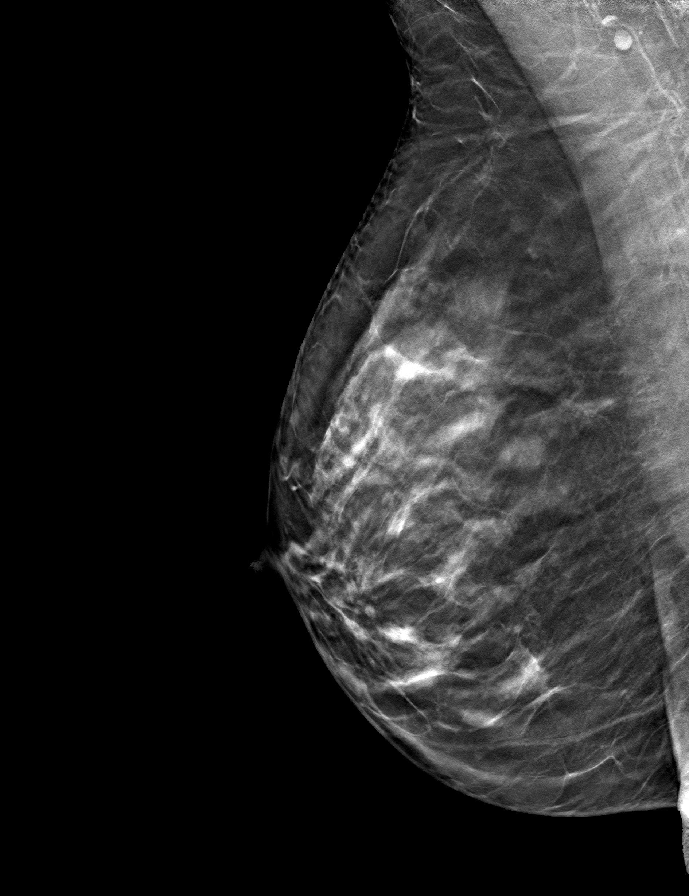
[frame 31/62]
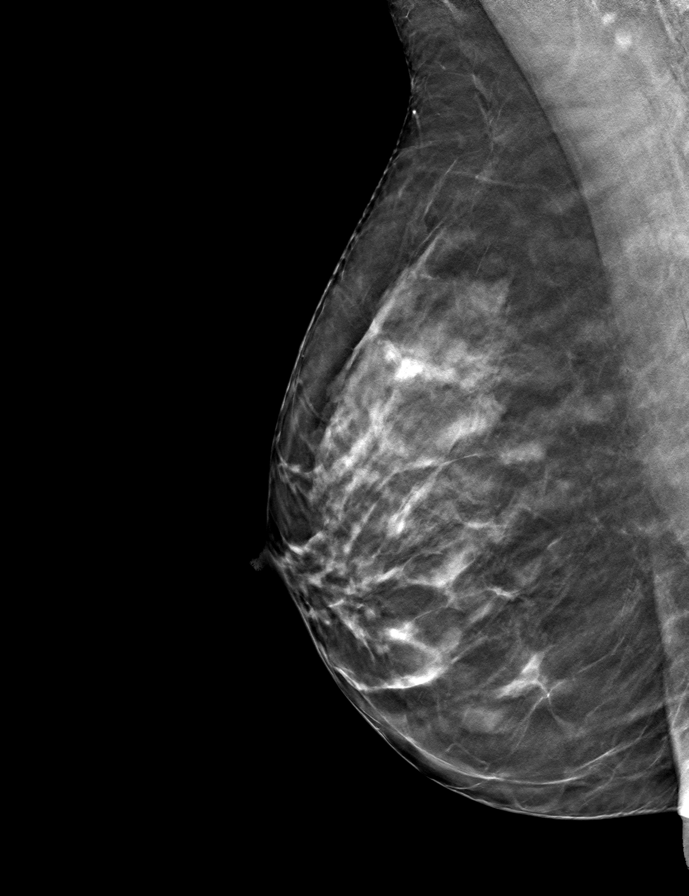

[R CC tomo · tomo slice 31/61.0]
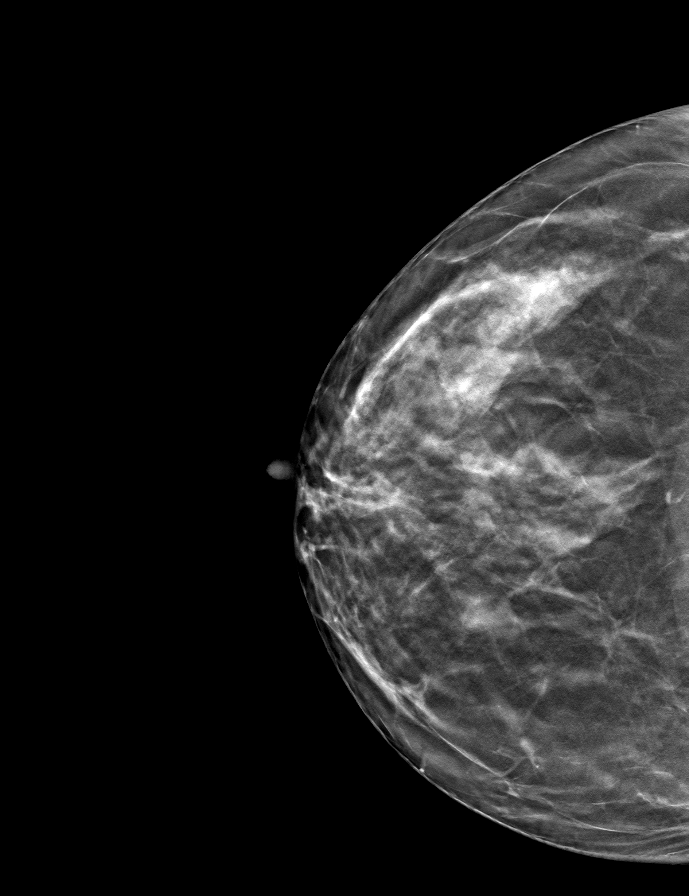

[L MLO tomo · tomo slice 31/62.0]
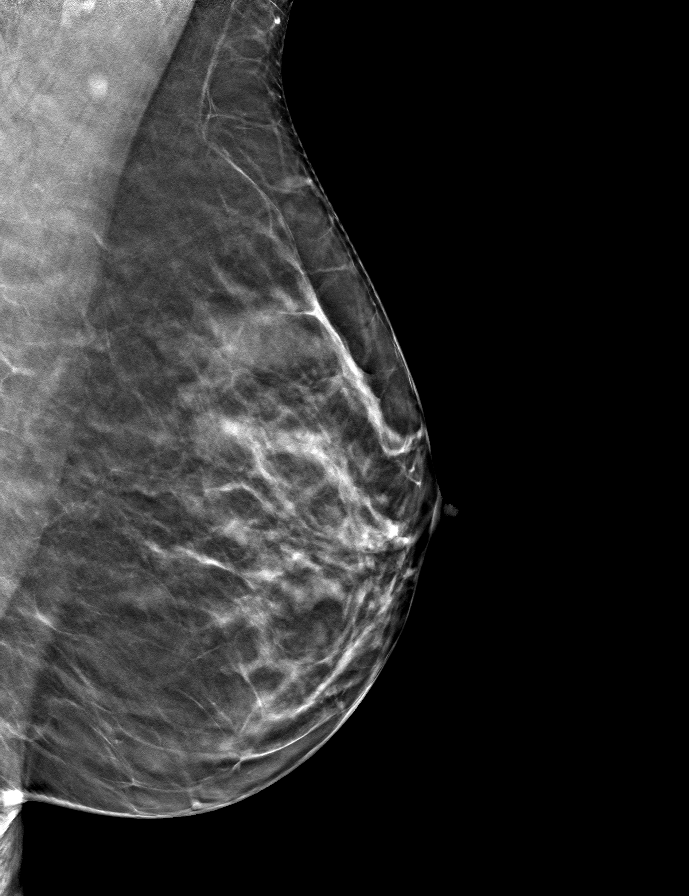

[L CC tomo · tomo slice 30/59.0]
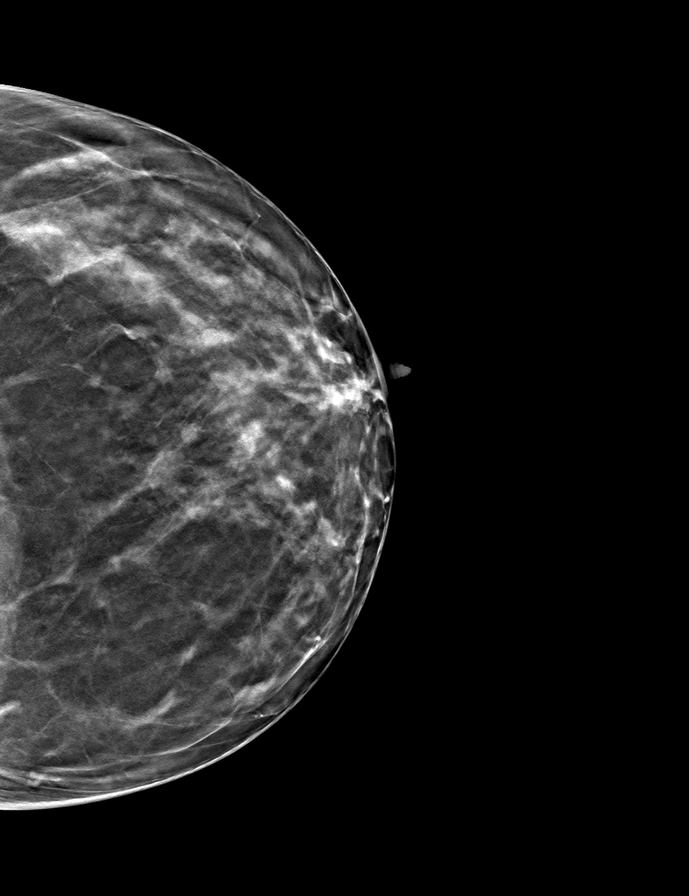

[9 of 24 positions shown; findings below may reference images not displayed]

ACR Breast Density Category c: The breast tissue is heterogeneously
dense, which may obscure small masses.
FINDINGS: In the left breast, a possible asymmetry warrants further
evaluation. In the right breast, no findings suspicious for
malignancy. Images were processed with CAD.
IMPRESSION: Further evaluation is suggested for possible asymmetry in the left
breast.

RECOMMENDATION:
Diagnostic mammogram and possibly ultrasound of the left breast.
(Code:[id number]-881)

The patient will be contacted regarding the findings, and additional
imaging will be scheduled.

BI-RADS CATEGORY  0: Incomplete. Need additional imaging evaluation
and/or prior mammograms for comparison.

## 2019-11-17 ENCOUNTER — Encounter: Payer: Self-pay | Admitting: Certified Nurse Midwife

## 2019-12-31 ENCOUNTER — Other Ambulatory Visit: Payer: Self-pay | Admitting: Obstetrics and Gynecology

## 2019-12-31 DIAGNOSIS — Z1231 Encounter for screening mammogram for malignant neoplasm of breast: Secondary | ICD-10-CM

## 2020-01-19 ENCOUNTER — Other Ambulatory Visit: Payer: Self-pay

## 2020-01-19 ENCOUNTER — Ambulatory Visit
Admission: RE | Admit: 2020-01-19 | Discharge: 2020-01-19 | Disposition: A | Discharge status: Home / Self Care | Payer: BC Managed Care – PPO | Source: Ambulatory Visit | Attending: Obstetrics and Gynecology | Admitting: Obstetrics and Gynecology

## 2020-01-19 DIAGNOSIS — Z1231 Encounter for screening mammogram for malignant neoplasm of breast: Secondary | ICD-10-CM

## 2020-05-25 NOTE — Progress Notes (Signed)
54 y.o. G64P2002 Married White or Caucasian Not Hispanic or Latino female here for annual exam.  Would like lab work  Period Cycle (Days): 32 Period Duration (Days): 5 Period Pattern: Regular Menstrual Flow: Light Menstrual Control: Thin pad Menstrual Control Change Freq (Hours): 4-6 Dysmenorrhea: None  Not having any significant vasomotor symptoms. No dyspareunia.   She has occasional urge incontinence, small amounts. No frequent urination.   She strained a few weeks ago while doing yard work and felt a vaginal bulge. Hasn't noticed it any other time. No trouble with voiding or BM's. No leakage.   Patient's last menstrual period was 05/17/2020.          Sexually active: Yes.    The current method of family planning is vasectomy.    Exercising: Yes.    Walking  Smoker:  no  Health Maintenance: Pap:  04/03/2016 WNL NEG HPV History of abnormal Pap:  no MMG:  01/19/20 density C Bi-rads 1 neg  BMD:  Never Colonoscopy: 12/27/2016 follow up 10 years TDaP: 2017 Gardasil: NA   reports that she has never smoked. She has never used smokeless tobacco. She reports current alcohol use of about 5.0 standard drinks of alcohol per week. She reports that she does not use drugs.  She is a Community education officer, working part time. 2 grown sons, one is going to [employer] at [place]. Other son has his MBA.   Past Medical History:  Diagnosis Date  . Retinitis pigmentosa 2012  . Rosacea 2009    Past Surgical History:  Procedure Laterality Date  . TONSILLECTOMY  1978    Current Outpatient Medications  Medication Sig Dispense Refill  . Calcium Carbonate-Vitamin D (CALCIUM-D PO) Take by mouth.    Marland Kitchen ibuprofen (ADVIL,MOTRIN) 100 MG tablet Take 100 mg by mouth every 6 (six) hours as needed for fever.     No current facility-administered medications for this visit.    Family History  Problem Relation Age of Onset  . Hypertension Mother   . Cancer Mother        melanoma on face  .  Hypertension Father   . Breast cancer Maternal Aunt   . Heart attack Paternal Grandfather   . Colon cancer Neg Hx     Review of Systems  All other systems reviewed and are negative.   Exam:   BP 130/74   Pulse 82   Ht 5' 7.25" (1.708 m)   Wt 149 lb (67.6 kg)   LMP 05/17/2020   SpO2 99%   BMI 23.16 kg/m   Weight change: @WEIGHTCHANGE @ Height:   Height: 5' 7.25" (170.8 cm)  Ht Readings from Last 3 Encounters:  05/26/20 5' 7.25" (1.708 m)  05/06/18 5' 7.5" (1.715 m)  05/02/17 5' 7.5" (1.715 m)    General appearance: alert, cooperative and appears stated age Head: Normocephalic, without obvious abnormality, atraumatic Neck: no adenopathy, supple, symmetrical, trachea midline and thyroid thyroid is not enlarged, but the right lobe>left lobe, concerning for nodule.  Lungs: clear to auscultation bilaterally Cardiovascular: regular rate and rhythm Breasts: normal appearance, no masses or tenderness Abdomen: soft, non-tender; non distended,  no masses,  no organomegaly Extremities: extremities normal, atraumatic, no cyanosis or edema Skin: Skin color, texture, turgor normal. No rashes or lesions Lymph nodes: Cervical, supraclavicular, and axillary nodes normal. No abnormal inguinal nodes palpated Neurologic: Grossly normal   Pelvic: External genitalia:  no lesions              Urethra:  normal  appearing urethra with no masses, tenderness or lesions              Bartholins and Skenes: normal                 Vagina: normal appearing vagina with normal color and discharge, no lesions. She has a small grade 2 rectocele with valsalva, no other significant prolapse.               Cervix: no lesions               Bimanual Exam:  Uterus:  normal size, contour, position, consistency, mobility, non-tender              Adnexa: no mass, fullness, tenderness               Rectovaginal: Confirms               Anus:  normal sphincter tone, no lesions  Gae Dry chaperoned for the  exam.  A:  Well Woman with normal exam  Vit d def  Exam concerning for right thyroid nodule  Small rectocele  P:   Pap next year  Fasting labs, vit d   Mammogram and colonoscopy UTD  Discussed breast self exam  Discussed calcium and vit D intake  TSH, thyroid ultrasound  ACOG handout on prolapse given, avoid heavy lifting, avoid straining.

## 2020-05-26 ENCOUNTER — Encounter: Payer: Self-pay | Admitting: Obstetrics and Gynecology

## 2020-05-26 ENCOUNTER — Ambulatory Visit (INDEPENDENT_AMBULATORY_CARE_PROVIDER_SITE_OTHER): Payer: BC Managed Care – PPO | Admitting: Obstetrics and Gynecology

## 2020-05-26 ENCOUNTER — Other Ambulatory Visit: Payer: Self-pay

## 2020-05-26 VITALS — BP 130/74 | HR 82 | Ht 67.25 in | Wt 149.0 lb

## 2020-05-26 DIAGNOSIS — Z0000 Encounter for general adult medical examination without abnormal findings: Secondary | ICD-10-CM | POA: Diagnosis not present

## 2020-05-26 DIAGNOSIS — Z01419 Encounter for gynecological examination (general) (routine) without abnormal findings: Secondary | ICD-10-CM | POA: Diagnosis not present

## 2020-05-26 DIAGNOSIS — N816 Rectocele: Secondary | ICD-10-CM

## 2020-05-26 DIAGNOSIS — E559 Vitamin D deficiency, unspecified: Secondary | ICD-10-CM

## 2020-05-26 DIAGNOSIS — E079 Disorder of thyroid, unspecified: Secondary | ICD-10-CM

## 2020-05-26 NOTE — Patient Instructions (Addendum)
EXERCISE AND DIET:  We recommended that you start or continue a regular exercise program for good health. Regular exercise means any activity that makes your heart beat faster and makes you sweat.  We recommend exercising at least 30 minutes per day at least 3 days a week, preferably 4 or 5.  We also recommend a diet low in fat and sugar.  Inactivity, poor dietary choices and obesity can cause diabetes, heart attack, stroke, and kidney damage, among others.   ° °ALCOHOL AND SMOKING:  Women should limit their alcohol intake to no more than 7 drinks/beers/glasses of wine (combined, not each!) per week. Moderation of alcohol intake to this level decreases your risk of breast cancer and liver damage. And of course, no recreational drugs are part of a healthy lifestyle.  And absolutely no smoking or even second hand smoke. Most people know smoking can cause heart and lung diseases, but did you know it also contributes to weakening of your bones? Aging of your skin?  Yellowing of your teeth and nails? ° °CALCIUM AND VITAMIN D:  Adequate intake of calcium and Vitamin D are recommended.  The recommendations for exact amounts of these supplements seem to change often, but generally speaking 1,000 mg of calcium (between diet and supplement) and 800 units of Vitamin D per day seems prudent. Certain women may benefit from higher intake of Vitamin D.  If you are among these women, your doctor will have told you during your visit.   ° °PAP SMEARS:  Pap smears, to check for cervical cancer or precancers,  have traditionally been done yearly, although recent scientific advances have shown that most women can have pap smears less often.  However, every woman still should have a physical exam from her gynecologist every year. It will include a breast check, inspection of the vulva and vagina to check for abnormal growths or skin changes, a visual exam of the cervix, and then an exam to evaluate the size and shape of the uterus and  ovaries.  And after 53 years of age, a rectal exam is indicated to check for rectal cancers. We will also provide age appropriate advice regarding health maintenance, like when you should have certain vaccines, screening for sexually transmitted diseases, bone density testing, colonoscopy, mammograms, etc.  ° °MAMMOGRAMS:  All women over 40 years old should have a yearly mammogram. Many facilities now offer a "3D" mammogram, which may cost around $50 extra out of pocket. If possible,  we recommend you accept the option to have the 3D mammogram performed.  It both reduces the number of women who will be called back for extra views which then turn out to be normal, and it is better than the routine mammogram at detecting truly abnormal areas.   ° °COLON CANCER SCREENING: Now recommend starting at age 45. At this time colonoscopy is not covered for routine screening until 50. There are take home tests that can be done between 45-49.  ° °COLONOSCOPY:  Colonoscopy to screen for colon cancer is recommended for all women at age 50.  We know, you hate the idea of the prep.  We agree, BUT, having colon cancer and not knowing it is worse!!  Colon cancer so often starts as a polyp that can be seen and removed at colonscopy, which can quite literally save your life!  And if your first colonoscopy is normal and you have no family history of colon cancer, most women don't have to have it again for   10 years.  Once every ten years, you can do something that may end up saving your life, right?  We will be happy to help you get it scheduled when you are ready.  Be sure to check your insurance coverage so you understand how much it will cost.  It may be covered as a preventative service at no cost, but you should check your particular policy.   ° ° ° °Breast Self-Awareness °Breast self-awareness means being familiar with how your breasts look and feel. It involves checking your breasts regularly and reporting any changes to your  health care provider. °Practicing breast self-awareness is important. A change in your breasts can be a sign of a serious medical problem. Being familiar with how your breasts look and feel allows you to find any problems early, when treatment is more likely to be successful. All women should practice breast self-awareness, including women who have had breast implants. °How to do a breast self-exam °One way to learn what is normal for your breasts and whether your breasts are changing is to do a breast self-exam. To do a breast self-exam: °Look for Changes ° °1. Remove all the clothing above your waist. °2. Stand in front of a mirror in a room with good lighting. °3. Put your hands on your hips. °4. Push your hands firmly downward. °5. Compare your breasts in the mirror. Look for differences between them (asymmetry), such as: °? Differences in shape. °? Differences in size. °? Puckers, dips, and bumps in one breast and not the other. °6. Look at each breast for changes in your skin, such as: °? Redness. °? Scaly areas. °7. Look for changes in your nipples, such as: °? Discharge. °? Bleeding. °? Dimpling. °? Redness. °? A change in position. °Feel for Changes °Carefully feel your breasts for lumps and changes. It is best to do this while lying on your back on the floor and again while sitting or standing in the shower or tub with soapy water on your skin. Feel each breast in the following way: °· Place the arm on the side of the breast you are examining above your head. °· Feel your breast with the other hand. °· Start in the nipple area and make ¾ inch (2 cm) overlapping circles to feel your breast. Use the pads of your three middle fingers to do this. Apply light pressure, then medium pressure, then firm pressure. The light pressure will allow you to feel the tissue closest to the skin. The medium pressure will allow you to feel the tissue that is a little deeper. The firm pressure will allow you to feel the tissue  close to the ribs. °· Continue the overlapping circles, moving downward over the breast until you feel your ribs below your breast. °· Move one finger-width toward the center of the body. Continue to use the ¾ inch (2 cm) overlapping circles to feel your breast as you move slowly up toward your collarbone. °· Continue the up and down exam using all three pressures until you reach your armpit. ° °Write Down What You Find ° °Write down what is normal for each breast and any changes that you find. Keep a written record with breast changes or normal findings for each breast. By writing this information down, you do not need to depend only on memory for size, tenderness, or location. Write down where you are in your menstrual cycle, if you are still menstruating. °If you are having trouble noticing differences   in your breasts, do not get discouraged. With time you will become more familiar with the variations in your breasts and more comfortable with the exam. °How often should I examine my breasts? °Examine your breasts every month. If you are breastfeeding, the best time to examine your breasts is after a feeding or after using a breast pump. If you menstruate, the best time to examine your breasts is 5-7 days after your period is over. During your period, your breasts are lumpier, and it may be more difficult to notice changes. °When should I see my health care provider? °See your health care provider if you notice: °· A change in shape or size of your breasts or nipples. °· A change in the skin of your breast or nipples, such as a reddened or scaly area. °· Unusual discharge from your nipples. °· A lump or thick area that was not there before. °· Pain in your breasts. °· Anything that concerns you. ° °About Rectocele ° °Overview ° °A rectocele is a type of hernia which causes different degrees of bulging of the rectal tissues into the vaginal wall.  You may even notice that it presses against the vaginal wall so much  that some vaginal tissues droop outside of the opening of your vagina. ° °Causes of Rectocele ° °The most common cause is childbirth.  The muscles and ligaments in the pelvis that hold up and support the female organs and vagina become stretched and weakened during labor and delivery.  The more babies you have, the more the support tissues are stretched and weakened.  Not everyone who has a baby will develop a rectocele.  Some women have stronger supporting tissue in the pelvis and may not have as much of a problem as others.  Women who have a Cesarean section usually do not get rectocele's unless they pushed a long time prior to the cesarean delivery. ° °Other conditions that can cause a rectocele include chronic constipation, a chronic cough, a lot of heavy lifting, and obesity.  Older women may have this problem because the loss of female hormones causes the vaginal tissue to become weaker. ° °Symptoms ° °There may not be any symptoms.  If you do have symptoms, they may include: °· Pelvic pressure in the rectal area °· Protrusion of the lower part of the vagina through the opening of the vagina °· Constipation and trapping of the stool, making it difficult to have a bowel movement.  In severe cases, you may have to press on the lower part of your vagina to help push the stool out of you rectum.  This is called splinting to empty. ° °Diagnosing Rectocele ° °Your health care provider will ask about your symptoms and perform a pelvic exam.  S/he will ask you to bear down, pushing like you are having a bowel movement so as to see how far the lower part of the vagina protrudes into the vagina and possible outside of the vagina.  Your provider will also ask you to contract the muscles of your pelvis (like you are stopping the stream in the middle of urinating) to determine the strength of your pelvic muscles.  Your provider may also do a rectal exam. ° °Treatment Options ° °If you do not have any symptoms, no treatment  may be necessary.  Other treatment options include: °· Pelvic floor exercises: Contracting the muscles in your genital area may help strengthen your muscles and support the organs.  Be sure to get   proper exercise instruction from you physical therapist. °· A pessary (removealbe pelvic support device) sometimes helps rectocele symptoms. °· Surgery: Surgical repair may be necessary. In some cases the uterus may need to be taken out ( a hysterectomy) as well.  There are many types of surgery for pelvic support problems.  Look for physicians who specialize in repair procedures. ° °You can take care of yourself by: °· Treating and preventing constipation °· Avoiding heavy lifting, and lifting correctly (with your legs, not with you waist or back) °· Treating a chronic cough or bronchitis °· Not smoking °· avoiding too much weight gain °· Doing pelvic floor exercises ° °© 2007, [employer] °

## 2020-05-27 LAB — COMPREHENSIVE METABOLIC PANEL
ALT: 14 IU/L (ref 0–32)
AST: 24 IU/L (ref 0–40)
Albumin/Globulin Ratio: 1.6 (ref 1.2–2.2)
Albumin: 4.4 g/dL (ref 3.8–4.9)
Alkaline Phosphatase: 61 IU/L (ref 44–121)
BUN/Creatinine Ratio: 13 (ref 9–23)
BUN: 10 mg/dL (ref 6–24)
Bilirubin Total: 0.3 mg/dL (ref 0.0–1.2)
CO2: 25 mmol/L (ref 20–29)
Calcium: 9 mg/dL (ref 8.7–10.2)
Chloride: 100 mmol/L (ref 96–106)
Creatinine, Ser: 0.8 mg/dL (ref 0.57–1.00)
GFR calc Af Amer: 97 mL/min/{1.73_m2} (ref >59)
GFR calc non Af Amer: 84 mL/min/{1.73_m2} (ref >59)
Globulin, Total: 2.7 g/dL (ref 1.5–4.5)
Glucose: 93 mg/dL (ref 65–99)
Potassium: 4.2 mmol/L (ref 3.5–5.2)
Sodium: 139 mmol/L (ref 134–144)
Total Protein: 7.1 g/dL (ref 6.0–8.5)

## 2020-05-27 LAB — CBC
Hematocrit: 42.1 % (ref 34.0–46.6)
Hemoglobin: 13.8 g/dL (ref 11.1–15.9)
MCH: 28.8 pg (ref 26.6–33.0)
MCHC: 32.8 g/dL (ref 31.5–35.7)
MCV: 88 fL (ref 79–97)
Platelets: 192 10*3/uL (ref 150–450)
RBC: 4.8 x10E6/uL (ref 3.77–5.28)
RDW: 13.3 % (ref 11.7–15.4)
WBC: 5.2 10*3/uL (ref 3.4–10.8)

## 2020-05-27 LAB — VITAMIN D 25 HYDROXY (VIT D DEFICIENCY, FRACTURES): Vit D, 25-Hydroxy: 38.2 ng/mL (ref 30.0–100.0)

## 2020-05-27 LAB — LIPID PANEL
Chol/HDL Ratio: 2.8 ratio (ref 0.0–4.4)
Cholesterol, Total: 223 mg/dL — ABNORMAL HIGH (ref 100–199)
HDL: 79 mg/dL (ref >39)
LDL Chol Calc (NIH): 130 mg/dL — ABNORMAL HIGH (ref 0–99)
Triglycerides: 82 mg/dL (ref 0–149)
VLDL Cholesterol Cal: 14 mg/dL (ref 5–40)

## 2020-05-27 LAB — TSH: TSH: 2.22 u[IU]/mL (ref 0.450–4.500)

## 2020-06-02 ENCOUNTER — Ambulatory Visit
Admission: RE | Admit: 2020-06-02 | Discharge: 2020-06-02 | Disposition: A | Discharge status: Home / Self Care | Payer: BC Managed Care – PPO | Source: Ambulatory Visit | Attending: Obstetrics and Gynecology | Admitting: Obstetrics and Gynecology

## 2020-06-02 DIAGNOSIS — E079 Disorder of thyroid, unspecified: Secondary | ICD-10-CM

## 2020-06-26 ENCOUNTER — Other Ambulatory Visit: Payer: Self-pay | Admitting: Family Medicine

## 2020-12-17 DIAGNOSIS — E041 Nontoxic single thyroid nodule: Secondary | ICD-10-CM | POA: Insufficient documentation

## 2021-01-12 ENCOUNTER — Other Ambulatory Visit: Payer: Self-pay | Admitting: Obstetrics and Gynecology

## 2021-01-12 DIAGNOSIS — Z1231 Encounter for screening mammogram for malignant neoplasm of breast: Secondary | ICD-10-CM

## 2021-03-16 ENCOUNTER — Other Ambulatory Visit: Payer: Self-pay

## 2021-03-16 ENCOUNTER — Ambulatory Visit
Admission: RE | Admit: 2021-03-16 | Discharge: 2021-03-16 | Disposition: A | Discharge status: Home / Self Care | Payer: BC Managed Care – PPO | Source: Ambulatory Visit | Attending: Obstetrics and Gynecology | Admitting: Obstetrics and Gynecology

## 2021-03-16 DIAGNOSIS — Z1231 Encounter for screening mammogram for malignant neoplasm of breast: Secondary | ICD-10-CM

## 2021-06-08 ENCOUNTER — Other Ambulatory Visit (HOSPITAL_COMMUNITY)
Admission: RE | Admit: 2021-06-08 | Discharge: 2021-06-08 | Disposition: A | Discharge status: Home / Self Care | Payer: BC Managed Care – PPO | Source: Ambulatory Visit | Attending: Obstetrics and Gynecology | Admitting: Obstetrics and Gynecology

## 2021-06-08 ENCOUNTER — Encounter: Payer: Self-pay | Admitting: Obstetrics and Gynecology

## 2021-06-08 ENCOUNTER — Other Ambulatory Visit: Payer: Self-pay

## 2021-06-08 ENCOUNTER — Ambulatory Visit (INDEPENDENT_AMBULATORY_CARE_PROVIDER_SITE_OTHER): Payer: BC Managed Care – PPO | Admitting: Obstetrics and Gynecology

## 2021-06-08 ENCOUNTER — Telehealth: Payer: Self-pay | Admitting: *Deleted

## 2021-06-08 VITALS — BP 116/64 | HR 66 | Ht 67.0 in | Wt 153.0 lb

## 2021-06-08 DIAGNOSIS — Z124 Encounter for screening for malignant neoplasm of cervix: Secondary | ICD-10-CM

## 2021-06-08 DIAGNOSIS — Z01419 Encounter for gynecological examination (general) (routine) without abnormal findings: Secondary | ICD-10-CM

## 2021-06-08 DIAGNOSIS — Z23 Encounter for immunization: Secondary | ICD-10-CM | POA: Diagnosis not present

## 2021-06-08 DIAGNOSIS — N926 Irregular menstruation, unspecified: Secondary | ICD-10-CM

## 2021-06-08 DIAGNOSIS — N951 Menopausal and female climacteric states: Secondary | ICD-10-CM

## 2021-06-08 DIAGNOSIS — E041 Nontoxic single thyroid nodule: Secondary | ICD-10-CM | POA: Diagnosis not present

## 2021-06-08 DIAGNOSIS — N3941 Urge incontinence: Secondary | ICD-10-CM

## 2021-06-08 MED ORDER — MEDROXYPROGESTERONE ACETATE 5 MG PO TABS: ORAL_TABLET | ORAL | 3 refills | Status: DC

## 2021-06-08 NOTE — Patient Instructions (Signed)
EXERCISE   We recommended that you start or continue a regular exercise program for good health. Physical activity is anything that gets your body moving, some is better than none. The CDC recommends 150 minutes per week of Moderate-Intensity Aerobic Activity and 2 or more days of Muscle Strengthening Activity.  Benefits of exercise are limitless: helps weight loss/weight maintenance, improves mood and energy, helps with depression and anxiety, improves sleep, tones and strengthens muscles, improves balance, improves bone density, protects from chronic conditions such as heart disease, high blood pressure and diabetes and so much more. To learn more visit: https://www.cdc.gov/physicalactivity/index.html  DIET: Good nutrition starts with a healthy diet of fruits, vegetables, whole grains, and lean protein sources. Drink plenty of water for hydration. Minimize empty calories, sodium, sweets. For more information about dietary recommendations visit: https://health.gov/our-work/nutrition-physical-activity/dietary-guidelines and https://www.myplate.gov/  ALCOHOL:  Women should limit their alcohol intake to no more than 7 drinks/beers/glasses of wine (combined, not each!) per week. Moderation of alcohol intake to this level decreases your risk of breast cancer and liver damage.  If you are concerned that you may have a problem, or your friends have told you they are concerned about your drinking, there are many resources to help. A well-known program that is free, effective, and available to all people all over the nation is Alcoholics Anonymous.  Check out this site to learn more: https://www.aa.org/   CALCIUM AND VITAMIN D:  Adequate intake of calcium and Vitamin D are recommended for bone health.  You should be getting between 1000-1200 mg of calcium and 800 units of Vitamin D daily between diet and supplements  PAP SMEARS:  Pap smears, to check for cervical cancer or precancers,  have traditionally been  done yearly, scientific advances have shown that most women can have pap smears less often.  However, every woman still should have a physical exam from her gynecologist every year. It will include a breast check, inspection of the vulva and vagina to check for abnormal growths or skin changes, a visual exam of the cervix, and then an exam to evaluate the size and shape of the uterus and ovaries. We will also provide age appropriate advice regarding health maintenance, like when you should have certain vaccines, screening for sexually transmitted diseases, bone density testing, colonoscopy, mammograms, etc.   MAMMOGRAMS:  All women over 40 years old should have a routine mammogram.   COLON CANCER SCREENING: Now recommend starting at age 45. At this time colonoscopy is not covered for routine screening until 50. There are take home tests that can be done between 45-49.   COLONOSCOPY:  Colonoscopy to screen for colon cancer is recommended for all women at age 50.  We know, you hate the idea of the prep.  We agree, BUT, having colon cancer and not knowing it is worse!!  Colon cancer so often starts as a polyp that can be seen and removed at colonscopy, which can quite literally save your life!  And if your first colonoscopy is normal and you have no family history of colon cancer, most women don't have to have it again for 10 years.  Once every ten years, you can do something that may end up saving your life, right?  We will be happy to help you get it scheduled when you are ready.  Be sure to check your insurance coverage so you understand how much it will cost.  It may be covered as a preventative service at no cost, but you should check   your particular policy.      Breast Self-Awareness Breast self-awareness means being familiar with how your breasts look and feel. It involves checking your breasts regularly and reporting any changes to your health care provider. Practicing breast self-awareness is  important. A change in your breasts can be a sign of a serious medical problem. Being familiar with how your breasts look and feel allows you to find any problems early, when treatment is more likely to be successful. All women should practice breast self-awareness, including women who have had breast implants. How to do a breast self-exam One way to learn what is normal for your breasts and whether your breasts are changing is to do a breast self-exam. To do a breast self-exam: Look for Changes  Remove all the clothing above your waist. Stand in front of a mirror in a room with good lighting. Put your hands on your hips. Push your hands firmly downward. Compare your breasts in the mirror. Look for differences between them (asymmetry), such as: Differences in shape. Differences in size. Puckers, dips, and bumps in one breast and not the other. Look at each breast for changes in your skin, such as: Redness. Scaly areas. Look for changes in your nipples, such as: Discharge. Bleeding. Dimpling. Redness. A change in position. Feel for Changes Carefully feel your breasts for lumps and changes. It is best to do this while lying on your back on the floor and again while sitting or standing in the shower or tub with soapy water on your skin. Feel each breast in the following way: Place the arm on the side of the breast you are examining above your head. Feel your breast with the other hand. Start in the nipple area and make  inch (2 cm) overlapping circles to feel your breast. Use the pads of your three middle fingers to do this. Apply light pressure, then medium pressure, then firm pressure. The light pressure will allow you to feel the tissue closest to the skin. The medium pressure will allow you to feel the tissue that is a little deeper. The firm pressure will allow you to feel the tissue close to the ribs. Continue the overlapping circles, moving downward over the breast until you feel your  ribs below your breast. Move one finger-width toward the center of the body. Continue to use the  inch (2 cm) overlapping circles to feel your breast as you move slowly up toward your collarbone. Continue the up and down exam using all three pressures until you reach your armpit.  Write Down What You Find  Write down what is normal for each breast and any changes that you find. Keep a written record with breast changes or normal findings for each breast. By writing this information down, you do not need to depend only on memory for size, tenderness, or location. Write down where you are in your menstrual cycle, if you are still menstruating. If you are having trouble noticing differences in your breasts, do not get discouraged. With time you will become more familiar with the variations in your breasts and more comfortable with the exam. How often should I examine my breasts? Examine your breasts every month. If you are breastfeeding, the best time to examine your breasts is after a feeding or after using a breast pump. If you menstruate, the best time to examine your breasts is 5-7 days after your period is over. During your period, your breasts are lumpier, and it may be more   difficult to notice changes. When should I see my health care provider? See your health care provider if you notice: A change in shape or size of your breasts or nipples. A change in the skin of your breast or nipples, such as a reddened or scaly area. Unusual discharge from your nipples. A lump or thick area that was not there before. Pain in your breasts. Anything that concerns you. Perimenopause Perimenopause is the normal time of a woman's life when the levels of estrogen, the female hormone produced by the ovaries, begin to decrease. This leads to changes in menstrual periods before they stop completely (menopause). Perimenopause can begin 2-8 years before menopause. During perimenopause, the ovaries may or may not  produce an egg and a woman can still become pregnant. What are the causes? This condition is caused by a natural change in hormone levels that happens as you get older. What increases the risk? This condition is more likely to start at an earlier age if you have certain medical conditions or have undergone treatments, including: A tumor of the pituitary gland in the brain. A disease that affects the ovaries and hormone production. Certain cancer treatments, such as chemotherapy or hormone therapy, or radiation therapy on the pelvis. Heavy smoking and excessive alcohol use. Family history of early menopause. What are the signs or symptoms? Perimenopausal changes affect each woman differently. Symptoms of this condition may include: Hot flashes. Irregular menstrual periods. Night sweats. Changes in feelings about sex. This could be a decrease in sex drive or an increased discomfort around your sexuality. Vaginal dryness. Headaches. Mood swings. Depression. Problems sleeping (insomnia). Memory problems or trouble concentrating. Irritability. Tiredness. Weight gain. Anxiety. Trouble getting pregnant. How is this diagnosed? This condition is diagnosed based on your medical history, a physical exam, your age, your menstrual history, and your symptoms. Hormone tests may also be done. How is this treated? In some cases, no treatment is needed. You and your health care provider should make a decision together about whether treatment is necessary. Treatment will be based on your individual condition and preferences. Various treatments are available, such as: Menopausal hormone therapy (MHT). Medicines to treat specific symptoms. Acupuncture. Vitamin or herbal supplements. Before starting treatment, make sure to let your health care provider know if you have a personal or family history of: Heart disease. Breast cancer. Blood clots. Diabetes. Osteoporosis. Follow these instructions at  home: Medicines Take over-the-counter and prescription medicines only as told by your health care provider. Take vitamin supplements only as told by your health care provider. Talk with your health care provider before starting any herbal supplements. Lifestyle  Do not use any products that contain nicotine or tobacco, such as cigarettes, e-cigarettes, and chewing tobacco. If you need help quitting, ask your health care provider. Get at least 30 minutes of physical activity on 5 or more days each week. Eat a balanced diet that includes fresh fruits and vegetables, whole grains, soybeans, eggs, lean meat, and low-fat dairy. Avoid alcoholic and caffeinated beverages, as well as spicy foods. This may help prevent hot flashes. Get 7-8 hours of sleep each night. Dress in layers that can be removed to help you manage hot flashes. Find ways to manage stress, such as deep breathing, meditation, or journaling. General instructions  Keep track of your menstrual periods, including: When they occur. How heavy they are and how long they last. How much time passes between periods. Keep track of your symptoms, noting when they start, how often  you have them, and how long they last. Use vaginal lubricants or moisturizers to help with vaginal dryness and improve comfort during sex. You can still become pregnant if you are having irregular periods. Make sure you use contraception during perimenopause if you do not want to get pregnant. Keep all follow-up visits. This is important. This includes any group therapy or counseling. Contact a health care provider if: You have heavy vaginal bleeding or pass blood clots. Your period lasts more than 2 days longer than normal. Your periods are recurring sooner than 21 days. You bleed after having sex. You have pain during sex. Get help right away if you have: Chest pain, trouble breathing, or trouble talking. Severe depression. Pain when you urinate. Severe  headaches. Vision problems. Summary Perimenopause is the time when a woman's body begins to move into menopause. This may happen naturally or as a result of other health problems or medical treatments. Perimenopause can begin 2-8 years before menopause, and it can last for several years. Perimenopausal symptoms can be managed through medicines, lifestyle changes, and complementary therapies such as acupuncture. This information is not intended to replace advice given to you by your health care provider. Make sure you discuss any questions you have with your health care provider. Document Revised: 01/29/2020 Document Reviewed: 01/29/2020 [employer] Patient Education  [place].

## 2021-06-08 NOTE — Progress Notes (Signed)
55 y.o. G89P2002 Married White or Caucasian Not Hispanic or Latino female here for annual exam.  Cycles went from monthly to every other month ~8 months ago, then monthly for a few months, now no cycle   Period Pattern: (!) Irregular Menstrual Flow: Moderate Menstrual Control: Maxi pad Menstrual Control Change Freq (Hours): 4 Dysmenorrhea: None No significant vasomotor symptoms or vaginal dryness. No dyspareunia.  She has occasional urge incontinence, small amounts.  H/O right thyroid nodule, due for an ultrasound.  Patient's last menstrual period was 03/28/2021.          Sexually active: Yes.    The current method of family planning is vasectomy.    Exercising: Yes.     Walking  Smoker:  no  Health Maintenance: Pap:   04/03/2016 WNL NEG HPV 12/04/12 WNL  History of abnormal Pap:  no MMG:  03/17/21 density C Bi-rads 1 neg BMD:   never  Colonoscopy: 12/27/2016 follow up 10 years TDaP: 2017 Gardasil: NA    reports that she has never smoked. She has never used smokeless tobacco. She reports current alcohol use of about 5.0 standard drinks per week. She reports that she does not use drugs. She is a Community education officer, working full time. 2 grown sons, one is going to [employer] at [place]. Other son has his MBA.   Past Medical History:  Diagnosis Date   Retinitis pigmentosa 2012   Rosacea 2009    Past Surgical History:  Procedure Laterality Date   TONSILLECTOMY  1978    Current Outpatient Medications  Medication Sig Dispense Refill   Calcium Carbonate-Vitamin D (CALCIUM-D PO) Take by mouth.     ibuprofen (ADVIL,MOTRIN) 100 MG tablet Take 100 mg by mouth every 6 (six) hours as needed for fever.     No current facility-administered medications for this visit.    Family History  Problem Relation Age of Onset   Hypertension Mother    Cancer Mother        melanoma on face   Hypertension Father    Breast cancer Maternal Aunt    Heart attack Paternal Grandfather    Colon  cancer Neg Hx     Review of Systems  All other systems reviewed and are negative.  Exam:   BP 116/64   Pulse 66   Ht 5\' 7"  (1.702 m)   Wt 153 lb (69.4 kg)   LMP 03/28/2021   SpO2 98%   BMI 23.96 kg/m   Weight change: @WEIGHTCHANGE @ Height:   Height: 5\' 7"  (170.2 cm)  Ht Readings from Last 3 Encounters:  06/08/21 5\' 7"  (1.702 m)  05/26/20 5' 7.25" (1.708 m)  05/06/18 5' 7.5" (1.715 m)    General appearance: alert, cooperative and appears stated age Head: Normocephalic, without obvious abnormality, atraumatic Neck: no adenopathy, supple, symmetrical, trachea midline and thyroid  right lobe enlarged compared to the left lobe.  Lungs: clear to auscultation bilaterally Cardiovascular: regular rate and rhythm Breasts: normal appearance, no masses or tenderness Abdomen: soft, non-tender; non distended,  no masses,  no organomegaly Extremities: extremities normal, atraumatic, no cyanosis or edema Skin: Skin color, texture, turgor normal. No rashes or lesions Lymph nodes: Cervical, supraclavicular, and axillary nodes normal. No abnormal inguinal nodes palpated Neurologic: Grossly normal   Pelvic: External genitalia:  no lesions              Urethra:  normal appearing urethra with no masses, tenderness or lesions  Bartholins and Skenes: normal                 Vagina: normal appearing vagina with normal color and discharge, no lesions              Cervix: no lesions               Bimanual Exam:  Uterus:  normal size, contour, position, consistency, mobility, non-tender              Adnexa: no mass, fullness, tenderness               Rectovaginal: Confirms               Anus:  normal sphincter tone, no lesions  Gae Dry chaperoned for the exam.  1. Well woman exam Discussed breast self exam Discussed calcium and vit D intake Labs with primary Flu shot today  2. Screening for cervical cancer - Cytology - PAP  3. Thyroid nodule Due for f/u ultrasound - US  SOFT TISSUE HEAD & NECK (NON-THYROID); Future  4. Irregular menses C/W perimenopause - medroxyPROGESTERone (PROVERA) 5 MG tablet; Take one tablet a day for 5 days every other month if no spontaneous cycles.  Dispense: 15 tablet; Refill: 3  5. Perimenopause Information given - medroxyPROGESTERone (PROVERA) 5 MG tablet; Take one tablet a day for 5 days every other month if no spontaneous cycles.  Dispense: 15 tablet; Refill: 3  6. Urge incontinence Bladder information given

## 2021-06-08 NOTE — Telephone Encounter (Addendum)
Appointment 10/19 4:00 arrive 3:45 pm Come in bye her self due to Covid protocol If need to r/s call 24 hrs in advance or $75 no show fee.  Left message for pt to return our call to give her the appointment details

## 2021-06-08 NOTE — Telephone Encounter (Signed)
-----   Message from Salvadore Dom, MD sent at 06/08/2021 12:02 PM EDT ----- Order placed for thyroid ultrasound

## 2021-06-09 LAB — CYTOLOGY - PAP
Comment: NEGATIVE
Diagnosis: NEGATIVE
High risk HPV: NEGATIVE

## 2021-06-09 NOTE — Telephone Encounter (Signed)
Patient returned [facility] call. 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 I called her and per DPR access note on file I left detailed message with thyroid u/s info and date/time. Phone # provided for [place] so she can check address locale with them.

## 2021-06-15 ENCOUNTER — Other Ambulatory Visit: Payer: BC Managed Care – PPO

## 2021-06-24 ENCOUNTER — Ambulatory Visit
Admission: RE | Admit: 2021-06-24 | Discharge: 2021-06-24 | Disposition: A | Discharge status: Home / Self Care | Payer: BC Managed Care – PPO | Source: Ambulatory Visit | Attending: Obstetrics and Gynecology | Admitting: Obstetrics and Gynecology

## 2021-06-24 DIAGNOSIS — E041 Nontoxic single thyroid nodule: Secondary | ICD-10-CM

## 2021-12-05 ENCOUNTER — Other Ambulatory Visit: Payer: Self-pay | Admitting: Family Medicine

## 2021-12-05 MED ORDER — NITROFURANTOIN MONOHYD MACRO 100 MG PO CAPS: 100.0000 mg | ORAL_CAPSULE | Freq: Two times a day (BID) | ORAL | 0 refills | Status: AC

## 2022-02-15 ENCOUNTER — Other Ambulatory Visit: Payer: Self-pay | Admitting: Obstetrics and Gynecology

## 2022-02-15 DIAGNOSIS — Z1231 Encounter for screening mammogram for malignant neoplasm of breast: Secondary | ICD-10-CM

## 2022-04-07 ENCOUNTER — Ambulatory Visit
Admission: RE | Admit: 2022-04-07 | Discharge: 2022-04-07 | Disposition: A | Discharge status: Home / Self Care | Payer: BC Managed Care – PPO | Source: Ambulatory Visit | Attending: Obstetrics and Gynecology | Admitting: Obstetrics and Gynecology

## 2022-04-07 DIAGNOSIS — Z1231 Encounter for screening mammogram for malignant neoplasm of breast: Secondary | ICD-10-CM

## 2022-06-14 NOTE — Progress Notes (Signed)
56 y.o. G64P2002 Married White or Caucasian Not Hispanic or Latino female here for annual exam.  She took provera for two months last year and had no bleeding. No dyspareunia.    H/O right thyroid nodule, due for an ultrasound.  Urge incontinence is mild and stable.   Requests screening labs today.  Patient's last menstrual period was 03/28/2021.          Sexually active: Yes.    The current method of family planning is post menopausal status.    Exercising: Yes.     Walking Smoker:  no  Health Maintenance: Pap:  06/08/21 WNL Hr HPV neg,  04/03/2016 WNL NEG HPV 12/04/12 WNL  History of abnormal Pap:  no MMG:  04/10/22 density B Bi-rads 1 neg  BMD:   never  Colonoscopy: 12/27/2016 follow up 10 years TDaP: 2017 Gardasil: NA   reports that she has never smoked. She has never used smokeless tobacco. She reports current alcohol use of about 5.0 standard drinks of alcohol per week. She reports that she does not use drugs. She is a Community education officer, working full time. 2 grown sons, one is going to [employer] at [place]. Other son has his MBA.   Past Medical History:  Diagnosis Date   Retinitis pigmentosa 2012   Rosacea 2009    Past Surgical History:  Procedure Laterality Date   TONSILLECTOMY  1978    Current Outpatient Medications  Medication Sig Dispense Refill   Calcium Carbonate-Vitamin D (CALCIUM-D PO) Take by mouth.     ibuprofen (ADVIL,MOTRIN) 100 MG tablet Take 100 mg by mouth every 6 (six) hours as needed for fever.     No current facility-administered medications for this visit.    Family History  Problem Relation Age of Onset   Hypertension Mother    Cancer Mother        melanoma on face   Hypertension Father    Breast cancer Maternal Aunt    Heart attack Paternal Grandfather    Colon cancer Neg Hx     Review of Systems  All other systems reviewed and are negative.   Exam:   BP 104/70   Pulse 72   Ht 5' 7.5" (1.715 m)   Wt 149 lb 12.8 oz (67.9 kg)    LMP 03/28/2021   SpO2 92%   BMI 23.12 kg/m   Weight change: '@WEIGHTCHANGE'$ @ Height:   Height: 5' 7.5" (171.5 cm)  Ht Readings from Last 3 Encounters:  06/21/22 5' 7.5" (1.715 m)  06/08/21 '5\' 7"'$  (1.702 m)  05/26/20 5' 7.25" (1.708 m)    General appearance: alert, cooperative and appears stated age Head: Normocephalic, without obvious abnormality, atraumatic Neck: no adenopathy, supple, symmetrical, trachea midline and thyroid  slightly enlarged on the right, c/w known nodule. Lungs: clear to auscultation bilaterally Cardiovascular: regular rate and rhythm Breasts: normal appearance, no masses or tenderness Abdomen: soft, non-tender; non distended,  no masses,  no organomegaly Extremities: extremities normal, atraumatic, no cyanosis or edema Skin: Skin color, texture, turgor normal. No rashes or lesions Lymph nodes: Cervical, supraclavicular, and axillary nodes normal. No abnormal inguinal nodes palpated Neurologic: Grossly normal   Pelvic: External genitalia:  no lesions              Urethra:  normal appearing urethra with no masses, tenderness or lesions              Bartholins and Skenes: normal  Vagina: normal appearing vagina with normal color and discharge, no lesions              Cervix: no lesions               Bimanual Exam:  Uterus:  normal size, contour, position, consistency, mobility, non-tender              Adnexa: no mass, fullness, tenderness               Rectovaginal: Confirms               Anus:  normal sphincter tone, no lesions  Gae Dry, CMa chaperoned for the exam.  1. Well woman exam Discussed breast self exam Discussed calcium and vit D intake Mammogram and colonoscopy UTD  2. Thyroid nodule Due for f/u u/s - US SOFT TISSUE HEAD & NECK (NON-THYROID); Future  3. Screening, lipid Fasting - Lipid panel  4. Laboratory exam ordered as part of routine general medical examination - CBC - Comprehensive metabolic panel  5. Vitamin  D deficiency Not consistently taking vit d - VITAMIN D 25 Hydroxy (Vit-D Deficiency, Fractures)

## 2022-06-21 ENCOUNTER — Encounter: Payer: Self-pay | Admitting: Obstetrics and Gynecology

## 2022-06-21 ENCOUNTER — Ambulatory Visit (INDEPENDENT_AMBULATORY_CARE_PROVIDER_SITE_OTHER): Payer: BC Managed Care – PPO | Admitting: Obstetrics and Gynecology

## 2022-06-21 VITALS — BP 104/70 | HR 72 | Ht 67.5 in | Wt 149.8 lb

## 2022-06-21 DIAGNOSIS — E041 Nontoxic single thyroid nodule: Secondary | ICD-10-CM

## 2022-06-21 DIAGNOSIS — Z0000 Encounter for general adult medical examination without abnormal findings: Secondary | ICD-10-CM

## 2022-06-21 DIAGNOSIS — E559 Vitamin D deficiency, unspecified: Secondary | ICD-10-CM | POA: Diagnosis not present

## 2022-06-21 DIAGNOSIS — Z01419 Encounter for gynecological examination (general) (routine) without abnormal findings: Secondary | ICD-10-CM

## 2022-06-21 DIAGNOSIS — Z13220 Encounter for screening for lipoid disorders: Secondary | ICD-10-CM

## 2022-06-21 NOTE — Patient Instructions (Signed)

## 2022-06-22 LAB — COMPREHENSIVE METABOLIC PANEL
AG Ratio: 1.4 (calc) (ref 1.0–2.5)
ALT: 16 U/L (ref 6–29)
AST: 25 U/L (ref 10–35)
Albumin: 4.4 g/dL (ref 3.6–5.1)
Alkaline phosphatase (APISO): 78 U/L (ref 37–153)
BUN: 15 mg/dL (ref 7–25)
CO2: 24 mmol/L (ref 20–32)
Calcium: 10 mg/dL (ref 8.6–10.4)
Chloride: 105 mmol/L (ref 98–110)
Creat: 0.98 mg/dL (ref 0.50–1.03)
Globulin: 3.1 g/dL (calc) (ref 1.9–3.7)
Glucose, Bld: 90 mg/dL (ref 65–99)
Potassium: 4.5 mmol/L (ref 3.5–5.3)
Sodium: 143 mmol/L (ref 135–146)
Total Bilirubin: 0.5 mg/dL (ref 0.2–1.2)
Total Protein: 7.5 g/dL (ref 6.1–8.1)

## 2022-06-22 LAB — LIPID PANEL
Cholesterol: 246 mg/dL — ABNORMAL HIGH (ref <200)
HDL: 86 mg/dL (ref >=50)
LDL Cholesterol (Calc): 144 mg/dL (calc) — ABNORMAL HIGH
Non-HDL Cholesterol (Calc): 160 mg/dL (calc) — ABNORMAL HIGH (ref <130)
Total CHOL/HDL Ratio: 2.9 (calc) (ref <5.0)
Triglycerides: 65 mg/dL (ref <150)

## 2022-06-22 LAB — CBC
HCT: 44.1 % (ref 35.0–45.0)
Hemoglobin: 14.9 g/dL (ref 11.7–15.5)
MCH: 28.6 pg (ref 27.0–33.0)
MCHC: 33.8 g/dL (ref 32.0–36.0)
MCV: 84.6 fL (ref 80.0–100.0)
MPV: 10 fL (ref 7.5–12.5)
Platelets: 188 10*3/uL (ref 140–400)
RBC: 5.21 10*6/uL — ABNORMAL HIGH (ref 3.80–5.10)
RDW: 14 % (ref 11.0–15.0)
WBC: 3.9 10*3/uL (ref 3.8–10.8)

## 2022-06-22 LAB — VITAMIN D 25 HYDROXY (VIT D DEFICIENCY, FRACTURES): Vit D, 25-Hydroxy: 36 ng/mL (ref 30–100)

## 2022-07-07 ENCOUNTER — Telehealth: Payer: Self-pay | Admitting: *Deleted

## 2022-07-07 DIAGNOSIS — E041 Nontoxic single thyroid nodule: Secondary | ICD-10-CM

## 2022-07-07 NOTE — Telephone Encounter (Signed)
Patient called was seen on 06/21/22 and thyroid ultrasound was ordered. Patient said she is trying to have ultrasound around her work schedule. However the ultrasound has a end date of 07/22/22 she is trying to schedule ultrasound in Dec, but the date needs to be changed.   Okay extend the end date so patient can have done in Dec?

## 2022-07-10 NOTE — Telephone Encounter (Signed)
New order placed. Left message on voicemail this has been done.

## 2022-07-10 NOTE — Telephone Encounter (Signed)
Yes, please extend the date.

## 2022-07-13 NOTE — Telephone Encounter (Signed)
Patient scheduled on 07/28/22 for thyroid ultrasound.

## 2022-07-28 ENCOUNTER — Ambulatory Visit
Admission: RE | Admit: 2022-07-28 | Discharge: 2022-07-28 | Disposition: A | Discharge status: Home / Self Care | Payer: BC Managed Care – PPO | Source: Ambulatory Visit | Attending: Obstetrics and Gynecology | Admitting: Obstetrics and Gynecology

## 2022-07-28 DIAGNOSIS — E041 Nontoxic single thyroid nodule: Secondary | ICD-10-CM

## 2023-04-18 ENCOUNTER — Other Ambulatory Visit: Payer: Self-pay | Admitting: Nurse Practitioner

## 2023-04-18 DIAGNOSIS — Z1231 Encounter for screening mammogram for malignant neoplasm of breast: Secondary | ICD-10-CM

## 2023-05-11 ENCOUNTER — Ambulatory Visit: Payer: BC Managed Care – PPO

## 2023-05-11 ENCOUNTER — Ambulatory Visit
Admission: RE | Admit: 2023-05-11 | Discharge: 2023-05-11 | Disposition: A | Discharge status: Home / Self Care | Payer: BC Managed Care – PPO | Source: Ambulatory Visit | Attending: Nurse Practitioner | Admitting: Nurse Practitioner

## 2023-05-11 DIAGNOSIS — Z1231 Encounter for screening mammogram for malignant neoplasm of breast: Secondary | ICD-10-CM

## 2023-06-26 ENCOUNTER — Other Ambulatory Visit: Payer: Self-pay | Admitting: Obstetrics & Gynecology

## 2023-06-26 DIAGNOSIS — E041 Nontoxic single thyroid nodule: Secondary | ICD-10-CM

## 2023-06-27 ENCOUNTER — Ambulatory Visit: Payer: BC Managed Care – PPO | Admitting: Obstetrics and Gynecology

## 2023-07-02 ENCOUNTER — Ambulatory Visit
Admission: RE | Admit: 2023-07-02 | Discharge: 2023-07-02 | Disposition: A | Discharge status: Home / Self Care | Payer: BC Managed Care – PPO | Source: Ambulatory Visit | Attending: Obstetrics & Gynecology | Admitting: Obstetrics & Gynecology

## 2023-07-02 DIAGNOSIS — E041 Nontoxic single thyroid nodule: Secondary | ICD-10-CM

## 2024-05-26 ENCOUNTER — Other Ambulatory Visit: Payer: Self-pay | Admitting: Physician Assistant

## 2024-05-26 DIAGNOSIS — Z1231 Encounter for screening mammogram for malignant neoplasm of breast: Secondary | ICD-10-CM

## 2024-06-13 ENCOUNTER — Ambulatory Visit

## 2024-06-20 ENCOUNTER — Ambulatory Visit
Admission: RE | Admit: 2024-06-20 | Discharge: 2024-06-20 | Disposition: A | Discharge status: Home / Self Care | Source: Ambulatory Visit | Attending: Physician Assistant | Admitting: Physician Assistant

## 2024-06-20 DIAGNOSIS — Z1231 Encounter for screening mammogram for malignant neoplasm of breast: Secondary | ICD-10-CM

## 2024-08-23 ENCOUNTER — Other Ambulatory Visit: Payer: Self-pay | Admitting: Family Medicine

## 2024-08-23 MED ORDER — POLYMYXIN B-TRIMETHOPRIM 10000-0.1 UNIT/ML-% OP SOLN: 2.0000 [drp] | Freq: Four times a day (QID) | OPHTHALMIC | 0 refills | Status: AC
